# Patient Record
Sex: Male | Born: 1985 | Race: White | Hispanic: No | Marital: Single | Smoking: Former smoker
Health system: Southern US, Community
[De-identification: ages and names within clinical notes are randomized; demographics above are authoritative.]

## PROBLEM LIST (undated history)

## (undated) DIAGNOSIS — F419 Anxiety disorder, unspecified: Secondary | ICD-10-CM

## (undated) DIAGNOSIS — Z87442 Personal history of urinary calculi: Secondary | ICD-10-CM

## (undated) DIAGNOSIS — F329 Major depressive disorder, single episode, unspecified: Secondary | ICD-10-CM

## (undated) DIAGNOSIS — F250 Schizoaffective disorder, bipolar type: Secondary | ICD-10-CM

## (undated) DIAGNOSIS — N200 Calculus of kidney: Secondary | ICD-10-CM

## (undated) HISTORY — PX: NO PAST SURGERIES: SHX2092

---

## 2007-04-16 ENCOUNTER — Inpatient Hospital Stay (HOSPITAL_COMMUNITY): Admission: EM | Admit: 2007-04-16 | Discharge: 2007-04-19 | Payer: Self-pay | Admitting: Emergency Medicine

## 2007-08-28 ENCOUNTER — Encounter: Admission: RE | Admit: 2007-08-28 | Discharge: 2007-08-28 | Payer: Self-pay | Admitting: Family Medicine

## 2007-08-28 ENCOUNTER — Ambulatory Visit: Payer: Self-pay | Admitting: Family Medicine

## 2007-08-28 DIAGNOSIS — IMO0002 Reserved for concepts with insufficient information to code with codable children: Secondary | ICD-10-CM | POA: Insufficient documentation

## 2007-08-28 DIAGNOSIS — M79609 Pain in unspecified limb: Secondary | ICD-10-CM

## 2008-06-30 ENCOUNTER — Inpatient Hospital Stay (HOSPITAL_COMMUNITY): Admission: RE | Admit: 2008-06-30 | Discharge: 2008-07-03 | Payer: Self-pay | Admitting: Psychiatry

## 2008-06-30 ENCOUNTER — Emergency Department (HOSPITAL_COMMUNITY): Admission: EM | Admit: 2008-06-30 | Discharge: 2008-06-30 | Payer: Self-pay | Admitting: Emergency Medicine

## 2008-06-30 ENCOUNTER — Ambulatory Visit: Payer: Self-pay | Admitting: Psychiatry

## 2010-09-07 LAB — ETHANOL: Alcohol, Ethyl (B): 209 mg/dL — ABNORMAL HIGH (ref 0–10)

## 2010-09-07 LAB — POCT I-STAT, CHEM 8
Calcium, Ion: 1.11 mmol/L — ABNORMAL LOW (ref 1.12–1.32)
Chloride: 106 mEq/L (ref 96–112)
Glucose, Bld: 84 mg/dL (ref 70–99)
TCO2: 29 mmol/L (ref 0–100)

## 2010-09-07 LAB — COMPREHENSIVE METABOLIC PANEL
BUN: 18 mg/dL (ref 6–23)
CO2: 29 mEq/L (ref 19–32)
GFR calc Af Amer: >60 mL/min (ref >60)
Glucose, Bld: 97 mg/dL (ref 70–99)
Sodium: 139 mEq/L (ref 135–145)
Total Bilirubin: 1 mg/dL (ref 0.3–1.2)
Total Protein: 8.1 g/dL (ref 6.0–8.3)

## 2010-09-07 LAB — ACETAMINOPHEN LEVEL: Acetaminophen (Tylenol), Serum: <10 ug/mL — ABNORMAL LOW (ref 10–30)

## 2010-09-07 LAB — RAPID URINE DRUG SCREEN, HOSP PERFORMED
Benzodiazepines: POSITIVE — AB
Cocaine: NOT DETECTED
Tetrahydrocannabinol: NOT DETECTED

## 2010-10-05 NOTE — Discharge Summary (Signed)
Walter Howe, Walter Howe            ACCOUNT NO.:  [id number]   MEDICAL RECORD NO.:  [id number]          PATIENT TYPE:  INP   LOCATION:  5735                         FACILITY:  [facility]   PHYSICIAN:  Isidor Holts, M.D.  DATE OF BIRTH:  Jul 15, 1985   DATE OF ADMISSION:  04/16/2007  DATE OF DISCHARGE:  04/19/2007                               DISCHARGE SUMMARY   PMD:  Unassigned.   DISCHARGE DIAGNOSES:  1. Acute Pancreatitis.  2. Right nephrolithiasis.   DISCHARGE MEDICATIONS:  1. Vicodin (5/325) one p.o. p.r.n. q.8 hourly. A total of 15 pills      have been dispensed.  2. Prilosec OTC 20 mg p.o. daily for one week only.   PROCEDURES:  1. Abdominal ultrasound scan dated April 16, 2007.  This was a      negative abdominal ultrasound, no gallstones.  2. Abdominal CT scan dated April 16, 2007.  This showed      pancreatitis and right nephrolithiasis.   CONSULTATIONS:  None.   ADMISSION HISTORY OF PRESENT ILLNESS:  As in H&P notes of April 16, 2007.  However, in brief this is a 25 year old male with no significant  past medical history, who presents with a one day history of upper  abdominal pain and vomiting.  He did have a transient episode of  diarrhea in the three days prior, but this had resolved by April 15, 2007.  On initial evaluation in the emergency department he was found to  have serum lipase 154.  Abdominal ultrasound scan showed no evidence of  gallstones.  He was admitted for further evaluation and investigation  and management.   CLINICAL COURSE:  1. Acute pancreatitis.  On detailed questioning, it appears that      patient had binged on alcohol, a couple of days before onset of      symptoms.  Although he has no history of chronic alcohol abuse,      this was likely the culprit for acute inflammation of the pancreas.      Abdominal CT scan was done on April 16, 2007.  This confirmed      acute pancreatitis, but showed no biliary tract  abnormalities.      There was an incidental finding of right nephrolithiasis.  Lipid      profile showed the following findings:  Total cholesterol 116,      triglycerides 63, HDL 45, LDL 58.  The patient was managed with      bowel rest, intravenous fluid hydration, proton pump inhibitor, and      analgesics.  By April 19, 2007, lipase was entirely normal at      16.  We were able to commence patient on clear fluid orally on      April 17, 2007, and advance to a regular low fat diet on      April 18, 2007 which he tolerated quite well.  The patient was      completely asymptomatic by April 19, 2007.   1. Right nephrolithiasis.  As mentioned in #2 above abdominal CT scan      showed  incidentally a small calculus in the interpolar region of      the right kidney.  This was asymptomatic.  The patient has been      informed accordingly.   DISPOSITION:  The patient was considered sufficiently recovered and  clinically stable to be discharged on April 19, 2007.  He is  recommended to return to regular duties on April 23, 2007.   DIET:  Low fat diet for one week.   ACTIVITY:  No restrictions.   FOLLOWUP:  This is not applicable, although the patient is recommended  to establish a primary MD for routine and preventive purposes.   DISCHARGE INSTRUCTIONS:  The patient has been strongly cautioned to  avoid alcohol.      Isidor Holts, M.D.  Electronically Signed     CO/MEDQ  D:  04/19/2007  T:  04/19/2007  Job:  [id number]

## 2010-10-05 NOTE — H&P (Signed)
NAMEJAVONTE, ELENES            ACCOUNT NO.:  [id number]   MEDICAL RECORD NO.:  [id number]          PATIENT TYPE:  IPS   LOCATION:  0507                          [facility]ACILITY[facility]  [facility]   PHYSICIAN:  Anselm Jungling, MD  DATE OF BIRTH:  [birth year]   DATE OF ADMISSION:  06/30/2008  DATE OF DISCHARGE:                       PSYCHIATRIC ADMISSION ASSESSMENT   IDENTIFYING INFORMATION:  A 25 year old male.  This is a voluntary  admission.   HISTORY OF THE PRESENT ILLNESS:  First [facility] admission for this 22-year-  old who presented in the emergency room after taking approximately 10  Xanax 0.5 mg tablets and subsequently drinking multiple shots of vodka.  He says I was depressed and stressed out.  He says that the Xanax  tablets were some that he had left over and he took all of them at once.  Does not really remember how much vodka he drank, denies having any  arguments or any acute stressors yesterday.  He was at his friend's  house and they found him on the bathroom floor and brought him to the  emergency room.  Today, he is oriented to person and basically the  situation.  He does not know what day it is and has a vague memory of  events occurring in the recent past.  He says that he has been drinking  alcohol heavily about 4 days out of every 7 days for the past 3 months  and most of those times drinking until he completely passes out.  Prior  to the past 3 months, he had several months of abstinence and is unable  to account for why he may have relapsed on the alcohol.  He does say  that he has been stressed by living with his grandmother who has  terminal cancer and recently his uncle and nephew moved into the home.  There has been a lot of arguing, fighting, and uproar in the house.  He  says that that helps to make him feel very overwhelmed and not able to  cope.  He appears to be having some thought blocking today and his  insight is quite impaired.   PAST PSYCHIATRIC HISTORY:  First  [facility] admission.  He has a history of 1  prior medical admission for acute pancreatitis in 2008 to our medical  unit and at that time was advised to stay off alcohol.  He reports that  he has had problems with drinking alcohol heavily for about 2 years but  has no history of alcohol detox or drug rehab.  Before beginning to  drink alcohol heavily, he reports that he was a regular user of cocaine  and marijuana.  He reports a history of mood problems and at times gets  so depressed and sometimes agitated that he has been unable to function  at work.  He was once prescribed an antidepressant as an outpatient  which he took for 3 days.  He felt much better and so stopped it.  He  does not remember the medication.  He is under no current outpatient  treatment.  Denies prior psychiatric hospitalizations.   SOCIAL HISTORY:  Single 25 year old male living in [place] with his  grandmother.  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  Never married.  No children.  He denies any current legal  problems.   FAMILY HISTORY:  Not available.   MEDICAL HISTORY:   PRIMARY CARE Orilla Templeman:  Not known.   MEDICAL PROBLEMS:  None.   PAST MEDICAL HISTORY:  Significant for:  1. Acute pancreatitis in November of 2008.  2. History of kidney stones in November of 2008.   CURRENT MEDICATIONS:  None.   DRUG ALLERGIES:  NONE.   POSITIVE PHYSICAL FINDINGS:  Slight-built Caucasian male in no distress.  Initial alcohol level 209 mg/dL.  Urine drug screen positive for  benzodiazepines.  Sodium very slightly elevated at 146.  Salicylate  level less than 4.  Acetaminophen level less than 10.  Chemistry, sodium  139, potassium 3.7, chloride 101, carbon dioxide 29, BUN 18, creatinine  0.81, random glucose 97, SGOT 39, SGPT 19, alkaline phosphatase 79,  total bilirubin 1.0, albumin 4.6, and calcium 9.6.   MENTAL STATUS  EXAM:  Fully alert male, pleasant, cooperative, but is  somewhat dazed affect, appears somewhat anxious.  No diaphoresis.  Oriented to person and situation.  Confused about days.  Having  difficulty with recall of specific events.  Immediate memory is intact.  Recent memory is poor.  Distant memory is intact.  Speech normal in  pace, tone, and production.  Mood appears anxious.  No tremor.  No  obvious neurologic signs.  Gait is stable.  No tremor.  No ataxia.  Thought process is logical, goal directed.  Responses are relevant and  appropriate.  Insight impaired.  Judgment impaired.  Impulse control  guarded.  Denying active suicidal or homicidal thoughts.   AXIS I:  1. Alcohol abuse, rule out dependence.  2. Rule out mood disorder, NOS.  AXIS II:  Deferred.  AXIS III:  No diagnosis.  AXIS IV:  Deferred.  AXIS V:  Current 38, past year not known.   PLAN:  Voluntarily admit him with a goal of stabilizing him.  He  recognizes that he has a problem with alcohol and is able to voice that  clearly.  We are starting him on a Librium protocol.  He is enrolled in  our dual diagnosis program.  Meanwhile, we are going to check a CBC, an  RPR, and a TSH and he has given [place] permission to speak with his parents  and would like to have them involved in treatment.      Margaret A. Lorin Picket, N.P.      Anselm Jungling, MD  Electronically Signed    MAS/MEDQ  D:  07/01/2008  T:  07/01/2008  Job:  [id number]

## 2010-10-05 NOTE — H&P (Signed)
Walter Howe            ACCOUNT NO.:  [id number]   MEDICAL RECORD NO.:  [id number]          PATIENT TYPE:  INP   LOCATION:  5735                         FACILITY:  [facility]   PHYSICIAN:  Isidor Holts, M.D.  DATE OF BIRTH:  1985/08/02   DATE OF ADMISSION:  04/16/2007  DATE OF DISCHARGE:                              HISTORY & PHYSICAL   PRIMARY MEDICAL DOCTOR:  Unassigned.   CHIEF COMPLAINT:  Abdominal pain and vomiting for 1 day.  Also,  transient diarrhea for the past 3 days, stopped on April 15, 2007.   HISTORY OF PRESENT ILLNESS:  This is a 25 year old male with no  significant past medical history.  According to patient, he drank quite  a lot of vodka, about 4 shots, on April 13, 2007, and again on  April 14, 2007 while at a friend's house.  Denies drug use.  About  3:30 a.m. on April 16, 2007, he developed upper abdominal pain  without any radiation, constant but fluctuating in intensity, associated  with vomiting about 3-4 times.  Vomited food.  Denies coffee ground  emesis.  In addition, the patient states that he had diarrhea for about  3 days prior, but this stopped on April 15, 2007.  He has had no  recurrences since.  Denies fever.  Denies shortness of breath.   PAST MEDICAL HISTORY:  Nothing of significance.   MEDICATION HISTORY:  The patient is not on any regular medication.   ALLERGIES:  No known drug allergies.   REVIEW OF SYSTEMS:  As per HPI and chief complaint otherwise negative.   SOCIAL HISTORY:  The patient is single.  He is a nonsmoker/nondrinker.  Works at [employer], and also at [employer].  Denies  alcohol abuse, although he occasionally binges.   FAMILY HISTORY:  Noncontributory.   PHYSICAL EXAMINATION:  VITAL SIGNS:  Temperature 98.3, pulse 73 per  minute and regular, respiratory rate 18, BP 116/71 mmHg, pulse oximeter  98% on room air.  The patient does not appear to be in obvious acute distress at time of  this evaluation, after administration of analgesics in the emergency  department.  He is alert, communicative, not short of breath at rest.  HEENT: No clinical pallor or jaundice.  No conjunctival injection.  THROAT:  Visible mucous membranes appear dry.  NECK:  Supple.  JVP not seen.  No palpable lymphadenopathy.  No palpable  goiter.  CHEST:  Clinically clear to auscultation.  No wheezes or crackles.  HEART SOUNDS:  1 and 2 heard.  Normal, regular; no murmurs.  ABDOMEN:  Flat, soft; tender in the epigastrium.  No guarding, no  rebound.  Bowel sounds are heard.  LOWER EXTREMITY EXAMINATION:  No pitting edema.  Palpable peripheral  pulses.  MUSCULOSKELETAL SYSTEM:  Unremarkable.  CENTRAL NERVOUS SYSTEM:  No focal neurologic deficits on gross  examination.   INVESTIGATIONS:  CBC:  WBC 13.4, neutrophils 63%, hemoglobin 17,  hematocrit 50, platelets 233.  Electrolytes:  sodium 138, potassium 3.7,  chloride 104, CO2 28.7, BUN 14, creatinine 1.1, glucose 83.  AST 22, ALT  11, alkaline phosphatase  53, lipase 154.  Abdominal ultrasound scan  dated April 16, 2007 is negative.  No evidence of gallstones.   ASSESSMENT AND PLAN:  Acute pancreatitis.  Etiology is likely acute  alcoholic binge, however, we shall do lipid profile to rule out  hypertriglyceridemia as a possible culprit.  The patient's abdominal  ultrasound scan shows no evidence of gallstones.  We shall admit the  patient, institute bowel rest, administer intravenous fluid hydration,  analgesics, proton pump inhibitor and antiemetics.  For completeness, we  will do abdominal CT scan to elucidate anatomy.  We shall also do urine  drug screen.   Further management will depend on clinical course.      Isidor Holts, M.D.  Electronically Signed     CO/MEDQ  D:  04/16/2007  T:  04/17/2007  Job:  [id number]

## 2010-10-08 NOTE — Discharge Summary (Signed)
NAMEOSAZE, [name]            ACCOUNT NO.:  [id number]   MEDICAL RECORD NO.:  [id number]          PATIENT TYPE:  IPS   LOCATION:  0507                          [facility]   PHYSICIAN:  Anselm Jungling, MD  DATE OF BIRTH:  04-Jun-1985   DATE OF ADMISSION:  06/30/2008  DATE OF DISCHARGE:  07/03/2008                               DISCHARGE SUMMARY   IDENTIFYING DATA AND REASON FOR ADMISSION:  This was an inpatient  psychiatric admission for Walter Howe, a 25 year old single white male  admitted after ingestion of Xanax and vodka.  He had been cutting on  himself, and in a state of delirium had apparently expressed some  suicidal ideation.  He had no known prior psychiatric history.  Please  refer to the admission note for further details pertaining to the  symptoms, circumstances and history that led to his hospitalization.  He  was given an initial Axis I diagnosis of polysubstance abuse, and rule  out psychosis NOS.   MEDICAL AND LABORATORY:  The patient was medically and physically  assessed by the psychiatric nurse practitioner.  He was in good health  without any active or chronic medical problems.  There were no  significant medical issues.   HOSPITAL COURSE:  The patient was admitted to the adult inpatient  psychiatric service.  He presented as a well-nourished, normally-  developed adult male who was slender, appearing younger than the age of  52.  He was alert and fully oriented.  He was pleasant, polite, but  unable to give much of a rationale for what happened to him that led to  his hospitalization.  However, he appeared to accept being on an  inpatient mental health unit and getting help there.  There were no  signs or symptoms of psychosis or thought disorder evident.   The patient was not treated with any psychotropic medications, pending  acquisition of further collateral information.   The patient had been living with his grandparents and planned to return  there.  He indicated that he was not depressed, that he was feeling  better after getting over his hangover from his alcohol ingestion.   On the fourth hospital day, he appeared appropriate for discharge.  He  agreed to the following aftercare plan.   AFTERCARE:  The patient was to follow up at [facility] with  an appointment on July 08, 2008 at 8 a.m.   DISCHARGE MEDICATIONS:  None.   DISCHARGE DIAGNOSIS:  AXIS I:  Polysubstance abuse not otherwise  specified.  AXIS II:  Deferred.  AXIS III:  No acute or chronic illnesses.  AXIS IV:  Stressors severe.  AXIS V:  GAF on discharge 65.      Anselm Jungling, MD  Electronically Signed     SPB/MEDQ  D:  07/07/2008  T:  07/07/2008  Job:  [id number]

## 2011-03-01 LAB — I-STAT 8, (EC8 V) (CONVERTED LAB)
Acid-Base Excess: 3 — ABNORMAL HIGH
BUN: 14
HCT: 50
Operator id: 198171
Sodium: 138
TCO2: 30
pCO2, Ven: 45.5
pH, Ven: 7.408 — ABNORMAL HIGH

## 2011-03-01 LAB — HEPATIC FUNCTION PANEL
ALT: 11
AST: 22
Albumin: 4.7
Alkaline Phosphatase: 53
Total Bilirubin: 1.1
Total Protein: 7.4

## 2011-03-01 LAB — DIFFERENTIAL
Eosinophils Absolute: 0 — ABNORMAL LOW
Lymphocytes Relative: 30
Monocytes Relative: 6
Neutro Abs: 8.5 — ABNORMAL HIGH
Neutrophils Relative %: 63

## 2011-03-01 LAB — LIPID PANEL
Cholesterol: 116
LDL Cholesterol: 58
Total CHOL/HDL Ratio: 2.6
Triglycerides: 63
VLDL: 13

## 2011-03-01 LAB — BASIC METABOLIC PANEL
BUN: 10
BUN: 6
CO2: 25
CO2: 29
Calcium: 8.4
Calcium: 8.5
Chloride: 102
Creatinine, Ser: 0.81
GFR calc Af Amer: >60
Glucose, Bld: 83
Potassium: 3.4 — ABNORMAL LOW
Sodium: 138

## 2011-03-01 LAB — URINALYSIS, ROUTINE W REFLEX MICROSCOPIC
Bilirubin Urine: NEGATIVE
Glucose, UA: NEGATIVE
Specific Gravity, Urine: 1.01
pH: 7.5

## 2011-03-01 LAB — LIPASE, BLOOD: Lipase: 154 — ABNORMAL HIGH

## 2011-03-01 LAB — POCT I-STAT CREATININE: Operator id: 198171

## 2011-03-01 LAB — BASIC METABOLIC PANEL WITH GFR
BUN: 4 — ABNORMAL LOW
CO2: 25
Calcium: 8.4
Chloride: 98
Creatinine, Ser: 0.99
GFR calc non Af Amer: >60
Glucose, Bld: 70
Potassium: 4.1
Sodium: 131 — ABNORMAL LOW

## 2011-03-01 LAB — CBC
HCT: 41.7
MCHC: 34.3
RBC: 5.36
RDW: 12.3
WBC: 12.4 — ABNORMAL HIGH

## 2011-03-01 LAB — URINE DRUGS OF ABUSE SCREEN W ALC, ROUTINE (REF LAB)
Amphetamine Screen, Ur: NEGATIVE
Cocaine Metabolites: NEGATIVE
Creatinine,U: 63.7
Phencyclidine (PCP): NEGATIVE

## 2011-03-01 LAB — B-NATRIURETIC PEPTIDE (CONVERTED LAB): Pro B Natriuretic peptide (BNP): 89

## 2011-03-01 LAB — THC (MARIJUANA), URINE, CONFIRMATION: Marijuana, Ur-Confirmation: 30 ng/mL

## 2011-03-01 LAB — MAGNESIUM: Magnesium: 1.8

## 2012-06-01 ENCOUNTER — Emergency Department (HOSPITAL_COMMUNITY): Payer: Self-pay

## 2012-06-01 ENCOUNTER — Emergency Department (HOSPITAL_COMMUNITY)
Admission: EM | Admit: 2012-06-01 | Discharge: 2012-06-01 | Disposition: A | Discharge status: Home / Self Care | Payer: Self-pay | Attending: Emergency Medicine | Admitting: Emergency Medicine

## 2012-06-01 ENCOUNTER — Encounter (HOSPITAL_COMMUNITY): Payer: Self-pay | Admitting: Emergency Medicine

## 2012-06-01 DIAGNOSIS — IMO0001 Reserved for inherently not codable concepts without codable children: Secondary | ICD-10-CM | POA: Insufficient documentation

## 2012-06-01 DIAGNOSIS — F17200 Nicotine dependence, unspecified, uncomplicated: Secondary | ICD-10-CM | POA: Insufficient documentation

## 2012-06-01 DIAGNOSIS — R059 Cough, unspecified: Secondary | ICD-10-CM | POA: Insufficient documentation

## 2012-06-01 DIAGNOSIS — R6889 Other general symptoms and signs: Secondary | ICD-10-CM

## 2012-06-01 DIAGNOSIS — J3489 Other specified disorders of nose and nasal sinuses: Secondary | ICD-10-CM | POA: Insufficient documentation

## 2012-06-01 DIAGNOSIS — R05 Cough: Secondary | ICD-10-CM

## 2012-06-01 LAB — CBC WITH DIFFERENTIAL/PLATELET
Basophils Absolute: 0 K/uL (ref 0.0–0.1)
Basophils Relative: 0 % (ref 0–1)
Eosinophils Absolute: 0 10*3/uL (ref 0.0–0.7)
Eosinophils Relative: 0 % (ref 0–5)
HCT: 47.6 % (ref 39.0–52.0)
Hemoglobin: 17.8 g/dL — ABNORMAL HIGH (ref 13.0–17.0)
Lymphocytes Relative: 37 % (ref 12–46)
Lymphs Abs: 2.5 K/uL (ref 0.7–4.0)
MCH: 31.4 pg (ref 26.0–34.0)
MCHC: 37.4 g/dL — ABNORMAL HIGH (ref 30.0–36.0)
MCV: 84 fL (ref 78.0–100.0)
Monocytes Absolute: 0.8 10*3/uL (ref 0.1–1.0)
Monocytes Relative: 11 % (ref 3–12)
Neutro Abs: 3.6 10*3/uL (ref 1.7–7.7)
Neutrophils Relative %: 52 % (ref 43–77)
Platelets: 166 K/uL (ref 150–400)
RBC: 5.67 MIL/uL (ref 4.22–5.81)
RDW: 12 % (ref 11.5–15.5)
WBC: 6.9 K/uL (ref 4.0–10.5)

## 2012-06-01 LAB — COMPREHENSIVE METABOLIC PANEL WITH GFR
ALT: 13 U/L (ref 0–53)
AST: 30 U/L (ref 0–37)
Alkaline Phosphatase: 75 U/L (ref 39–117)
CO2: 29 meq/L (ref 19–32)
Calcium: 9.9 mg/dL (ref 8.4–10.5)
GFR calc Af Amer: >90 mL/min (ref >90)
GFR calc non Af Amer: >90 mL/min (ref >90)
Glucose, Bld: 87 mg/dL (ref 70–99)
Potassium: 3.9 meq/L (ref 3.5–5.1)
Sodium: 138 meq/L (ref 135–145)
Total Protein: 8.6 g/dL — ABNORMAL HIGH (ref 6.0–8.3)

## 2012-06-01 LAB — COMPREHENSIVE METABOLIC PANEL
Albumin: 4.6 g/dL (ref 3.5–5.2)
BUN: 17 mg/dL (ref 6–23)
Chloride: 94 mEq/L — ABNORMAL LOW (ref 96–112)
Creatinine, Ser: 0.75 mg/dL (ref 0.50–1.35)
Total Bilirubin: 0.6 mg/dL (ref 0.3–1.2)

## 2012-06-01 MED ORDER — BENZONATATE 100 MG PO CAPS
200.0000 mg | ORAL_CAPSULE | Freq: Once | ORAL | Status: AC
  Administered 2012-06-01: 200 mg via ORAL
  Filled 2012-06-01: qty 2

## 2012-06-01 MED ORDER — BENZONATATE 100 MG PO CAPS: 100.0000 mg | ORAL_CAPSULE | Freq: Three times a day (TID) | ORAL | Status: DC | PRN

## 2012-06-01 NOTE — ED Notes (Signed)
Pt c/o non-productive cough, and body aches since Mon.  No appetite or energy.  Also elevated temp off and on.  Mask placed on pt in triage.

## 2012-06-01 NOTE — ED Provider Notes (Signed)
I saw and evaluated the patient, reviewed the resident's note and I agree with the findings and plan.  Patient with no fever, has symptoms of myalgias and flulike symptoms. He is a positive sick contact. Patient's symptoms seem to be resolving at this time, as noted no respiratory distress and is nontoxic in appearance. The patient is reassured and is stable for discharge to home. He can take antipyretics and push by mouth fluids at home.  Gavin Pound. Tali Cleaves, MD 06/01/12 2320

## 2012-06-01 NOTE — ED Provider Notes (Signed)
History     CSN: [id number]  Arrival date & time 06/01/12  1842   First MD Initiated Contact with Patient 06/01/12 2202      Chief Complaint  Patient presents with  . Generalized Body Aches    (Consider location/radiation/quality/duration/timing/severity/associated sxs/prior treatment) HPI chief complaint: Flulike symptoms. Onset: 5 days ago. Improving spontaneously. Severity: Mild. Timing: Constant. For signs and symptoms to review of systems. Regarding patient's social history he does smoke. I have reviewed patient's past medical, past surgical, social history as well as medications and allergies. Context: Patient's roommate was regionally diagnosed with the flu.  History reviewed. No pertinent past medical history.  History reviewed. No pertinent past surgical history.  No family history on file.  History  Substance Use Topics  . Smoking status: Current Every Day Smoker  . Smokeless tobacco: Not on file  . Alcohol Use: Yes     Comment: ocassional      Review of Systems  Constitutional: Negative for fever and chills.  HENT: Positive for rhinorrhea. Negative for hearing loss, ear pain, congestion, sore throat, facial swelling, trouble swallowing, neck pain, neck stiffness, voice change, sinus pressure and ear discharge.   Eyes: Negative for visual disturbance.  Respiratory: Positive for choking. Negative for cough, chest tightness and shortness of breath.   Cardiovascular: Negative for chest pain, palpitations and leg swelling.  Gastrointestinal: Negative for nausea, vomiting, abdominal pain, diarrhea, constipation, blood in stool and abdominal distention.  Genitourinary: Negative for dysuria, urgency, hematuria and difficulty urinating.  Musculoskeletal: Positive for myalgias. Negative for back pain and gait problem.  Skin: Negative for rash.  Neurological: Negative for dizziness, tremors, seizures, syncope, facial asymmetry, speech difficulty, weakness, light-headedness,  numbness and headaches.  Hematological: Negative for adenopathy. Does not bruise/bleed easily.  Psychiatric/Behavioral: Negative for confusion.    Allergies  Review of patient's allergies indicates no known allergies.  Home Medications  No current outpatient prescriptions on file.  BP 116/80  Pulse 129  Temp 99.7 F (37.6 C) (Oral)  Resp 16  SpO2 100%  Physical Exam  Constitutional: He is oriented to person, place, and time. He appears well-developed and well-nourished. No distress.  HENT:  Head: Normocephalic.  Eyes: Conjunctivae normal are normal.  Neck: Normal range of motion. Neck supple.  Cardiovascular: Normal rate, regular rhythm, normal heart sounds and intact distal pulses.   No murmur heard. Pulmonary/Chest: Effort normal and breath sounds normal. No respiratory distress. He has no wheezes. He has no rales.  Abdominal: Soft. Bowel sounds are normal. He exhibits no distension. There is no tenderness.  Musculoskeletal: Normal range of motion. He exhibits no edema and no tenderness.  Neurological: He is alert and oriented to person, place, and time.  Skin: Skin is warm and dry. He is not diaphoretic.  Psychiatric: He has a normal mood and affect.    ED Course  Procedures (including critical care time)  Labs Reviewed  CBC WITH DIFFERENTIAL - Abnormal; Notable for the following:    Hemoglobin 17.8 (*)     MCHC 37.4 (*)  RULED OUT INTERFERING SUBSTANCES   All other components within normal limits  COMPREHENSIVE METABOLIC PANEL - Abnormal; Notable for the following:    Chloride 94 (*)     Total Protein 8.6 (*)     All other components within normal limits   Dg Chest 2 View  06/01/2012  *RADIOLOGY REPORT*  Clinical Data: Cough and congestion  CHEST - 2 VIEW  Comparison: None  Findings: Lungs are hyperinflated but  clear.  No pleural effusion or edema identified.  No airspace consolidation noted.  Visualized osseous structures appear unremarkable.  IMPRESSION:  1.  No  active cardiopulmonary abnormalities.   Original Report Authenticated By: Signa Kell, M.D.      1. Flu-like symptoms   2. Cough       MDM  Patient is a very well-appearing 27 year old male presenting with 5 days of flulike symptoms that are resolving. Patient states he came because he has a mild nonproductive cough which is also improving. Vital stable. Afebrile. Recent influenza contact but given patient has had symptoms for 5 days which are improving I feel no need to test and patient outside the window for Tamiflu. Labs unremarkable. Chest x-ray negative. Discharge with Jerilynn Som and return precautions.        Consuello Masse, MD 06/01/12 [phone]

## 2012-06-18 ENCOUNTER — Emergency Department (HOSPITAL_COMMUNITY)
Admission: EM | Admit: 2012-06-18 | Discharge: 2012-06-18 | Disposition: A | Discharge status: Home / Self Care | Payer: Self-pay | Attending: Emergency Medicine | Admitting: Emergency Medicine

## 2012-06-18 ENCOUNTER — Encounter (HOSPITAL_COMMUNITY): Payer: Self-pay | Admitting: *Deleted

## 2012-06-18 ENCOUNTER — Emergency Department (HOSPITAL_COMMUNITY): Payer: Self-pay

## 2012-06-18 DIAGNOSIS — N200 Calculus of kidney: Secondary | ICD-10-CM | POA: Insufficient documentation

## 2012-06-18 DIAGNOSIS — R112 Nausea with vomiting, unspecified: Secondary | ICD-10-CM | POA: Insufficient documentation

## 2012-06-18 DIAGNOSIS — F17200 Nicotine dependence, unspecified, uncomplicated: Secondary | ICD-10-CM | POA: Insufficient documentation

## 2012-06-18 DIAGNOSIS — N390 Urinary tract infection, site not specified: Secondary | ICD-10-CM | POA: Insufficient documentation

## 2012-06-18 HISTORY — DX: Calculus of kidney: N20.0

## 2012-06-18 LAB — URINALYSIS, ROUTINE W REFLEX MICROSCOPIC
Ketones, ur: 15 mg/dL — AB
Nitrite: NEGATIVE
Urobilinogen, UA: 1 mg/dL (ref 0.0–1.0)

## 2012-06-18 LAB — CBC WITH DIFFERENTIAL/PLATELET
Lymphocytes Relative: 23 % (ref 12–46)
Lymphs Abs: 3.1 10*3/uL (ref 0.7–4.0)
MCV: 86.6 fL (ref 78.0–100.0)
Neutro Abs: 9.1 10*3/uL — ABNORMAL HIGH (ref 1.7–7.7)
Neutrophils Relative %: 67 % (ref 43–77)
Platelets: 161 10*3/uL (ref 150–400)
RBC: 5.09 MIL/uL (ref 4.22–5.81)
WBC: 13.5 10*3/uL — ABNORMAL HIGH (ref 4.0–10.5)

## 2012-06-18 LAB — COMPREHENSIVE METABOLIC PANEL
ALT: 9 U/L (ref 0–53)
Alkaline Phosphatase: 80 U/L (ref 39–117)
CO2: 27 mEq/L (ref 19–32)
Chloride: 97 mEq/L (ref 96–112)
GFR calc Af Amer: >90 mL/min (ref >90)
GFR calc non Af Amer: 81 mL/min — ABNORMAL LOW (ref >90)
Glucose, Bld: 100 mg/dL — ABNORMAL HIGH (ref 70–99)
Potassium: 3.7 mEq/L (ref 3.5–5.1)
Sodium: 135 mEq/L (ref 135–145)

## 2012-06-18 LAB — URINE MICROSCOPIC-ADD ON

## 2012-06-18 MED ORDER — OXYCODONE-ACETAMINOPHEN 5-325 MG PO TABS: 1.0000 | ORAL_TABLET | Freq: Four times a day (QID) | ORAL | Status: DC | PRN

## 2012-06-18 MED ORDER — TAMSULOSIN HCL 0.4 MG PO CAPS: 0.4000 mg | ORAL_CAPSULE | Freq: Every day | ORAL | Status: DC

## 2012-06-18 MED ORDER — ONDANSETRON HCL 4 MG/2ML IJ SOLN
4.0000 mg | Freq: Once | INTRAMUSCULAR | Status: AC
  Administered 2012-06-18: 4 mg via INTRAVENOUS

## 2012-06-18 MED ORDER — SULFAMETHOXAZOLE-TRIMETHOPRIM 800-160 MG PO TABS: 1.0000 | ORAL_TABLET | Freq: Two times a day (BID) | ORAL | Status: DC

## 2012-06-18 MED ORDER — HYDROMORPHONE HCL PF 1 MG/ML IJ SOLN
INTRAMUSCULAR | Status: AC
  Administered 2012-06-18: 0.5 mg via INTRAVENOUS
  Filled 2012-06-18: qty 1

## 2012-06-18 MED ORDER — DEXTROSE 5 % IV SOLN
1.0000 g | Freq: Once | INTRAVENOUS | Status: AC
  Administered 2012-06-18: 1 g via INTRAVENOUS
  Filled 2012-06-18: qty 10

## 2012-06-18 MED ORDER — HYDROMORPHONE HCL PF 1 MG/ML IJ SOLN
0.5000 mg | Freq: Once | INTRAMUSCULAR | Status: AC
  Administered 2012-06-18: 0.5 mg via INTRAVENOUS

## 2012-06-18 MED ORDER — ONDANSETRON HCL 4 MG PO TABS
4.0000 mg | ORAL_TABLET | Freq: Four times a day (QID) | ORAL | Status: DC
Start: 2012-06-18 — End: 2015-07-08

## 2012-06-18 MED ORDER — SODIUM CHLORIDE 0.9 % IV SOLN
Freq: Once | INTRAVENOUS | Status: AC
  Administered 2012-06-18: 11:00:00 via INTRAVENOUS

## 2012-06-18 MED ORDER — HYDROMORPHONE HCL PF 1 MG/ML IJ SOLN
0.5000 mg | Freq: Once | INTRAMUSCULAR | Status: AC
  Administered 2012-06-18: 0.5 mg via INTRAVENOUS
  Filled 2012-06-18: qty 1

## 2012-06-18 MED ORDER — ONDANSETRON HCL 4 MG/2ML IJ SOLN
INTRAMUSCULAR | Status: AC
  Administered 2012-06-18: 4 mg via INTRAVENOUS
  Filled 2012-06-18: qty 2

## 2012-06-18 NOTE — ED Provider Notes (Signed)
Medical screening examination/treatment/procedure(s) were performed by non-physician practitioner and as supervising physician I was immediately available for consultation/collaboration.   Carleene Cooper III, MD 06/18/12 2030

## 2012-06-18 NOTE — ED Notes (Signed)
C/o left flank pain, n/v onset yesterday worsening this morning. Reports feels like same pain when he had previous kidney stone. Denies hematuria, dysuria, frequency.

## 2012-06-18 NOTE — ED Provider Notes (Signed)
History     CSN: [id number]  Arrival date & time 06/18/12  5621   First MD Initiated Contact with Patient 06/18/12 1035      Chief Complaint  Patient presents with  . Flank Pain    (Consider location/radiation/quality/duration/timing/severity/associated sxs/prior treatment) HPI  27 year old male with history of kidney stones presents complaining of flank pain. Patient reports experiencing sharp pain to his left flank yesterday while sitting at the computer. Pain is intermittent, moderate in severity, usually lasting from seconds to minutes. Pain is getting progressively worse throughout the day, moderate to severe, nonradiating, with associate nausea and occasional nonbloody nonbilious vomit. Nothing seems to make the pain better or worse. Denies fever, chills, chest pain, shortness of breath, abdominal pain, hematuria, penile pain or scrotal pain. Pain feels similar to prior history of kidney stone, last episode was 4 years ago and affect and the right kidney. No prior history of stenting or lithotripsy. Patient does not have a urologist. Denies any recent trauma.  Past Medical History  Diagnosis Date  . Kidney stone     History reviewed. No pertinent past surgical history.  No family history on file.  History  Substance Use Topics  . Smoking status: Current Every Day Smoker  . Smokeless tobacco: Not on file  . Alcohol Use: Yes     Comment: ocassional      Review of Systems  Constitutional:       A complete 10 system review of systems was obtained and all systems are negative except as noted in the HPI and PMH.    Allergies  Review of patient's allergies indicates no known allergies.  Home Medications   Current Outpatient Rx  Name  Route  Sig  Dispense  Refill  . BENZONATATE 100 MG PO CAPS   Oral   Take 1 capsule (100 mg total) by mouth 3 (three) times daily as needed for cough.   21 capsule   0     BP 125/87  Pulse 103  Temp 98 F (36.7 C) (Oral)  Resp  18  SpO2 99%  Physical Exam  Nursing note and vitals reviewed. Constitutional: He is oriented to person, place, and time. He appears well-developed and well-nourished. He appears distressed (appears uncomfortable laying in bed).  HENT:  Head: Atraumatic.  Eyes: Conjunctivae normal are normal.  Neck: Neck supple.  Cardiovascular: Normal rate and regular rhythm.   Pulmonary/Chest: Effort normal and breath sounds normal.  Abdominal: Soft. There is no tenderness. No hernia.       Significant left CVA tenderness on percussion  Musculoskeletal: He exhibits no edema.  Neurological: He is alert and oriented to person, place, and time.  Skin: Skin is warm. No rash noted.  Psychiatric: He has a normal mood and affect.    ED Course  Procedures (including critical care time)   Labs Reviewed  CBC WITH DIFFERENTIAL  COMPREHENSIVE METABOLIC PANEL  URINALYSIS, ROUTINE W REFLEX MICROSCOPIC   Results for orders placed during the hospital encounter of 06/18/12  CBC WITH DIFFERENTIAL      Component Value Range   WBC 13.5 (*) 4.0 - 10.5 K/uL   RBC 5.09  4.22 - 5.81 MIL/uL   Hemoglobin 15.9  13.0 - 17.0 g/dL   HCT 30.8  65.7 - 84.6 %   MCV 86.6  78.0 - 100.0 fL   MCH 31.2  26.0 - 34.0 pg   MCHC 36.1 (*) 30.0 - 36.0 g/dL   RDW 96.2  95.2 - 84.1 %  Platelets 161  150 - 400 K/uL   Neutrophils Relative 67  43 - 77 %   Neutro Abs 9.1 (*) 1.7 - 7.7 K/uL   Lymphocytes Relative 23  12 - 46 %   Lymphs Abs 3.1  0.7 - 4.0 K/uL   Monocytes Relative 9  3 - 12 %   Monocytes Absolute 1.2 (*) 0.1 - 1.0 K/uL   Eosinophils Relative 1  0 - 5 %   Eosinophils Absolute 0.1  0.0 - 0.7 K/uL   Basophils Relative 0  0 - 1 %   Basophils Absolute 0.0  0.0 - 0.1 K/uL  COMPREHENSIVE METABOLIC PANEL      Component Value Range   Sodium 135  135 - 145 mEq/L   Potassium 3.7  3.5 - 5.1 mEq/L   Chloride 97  96 - 112 mEq/L   CO2 27  19 - 32 mEq/L   Glucose, Bld 100 (*) 70 - 99 mg/dL   BUN 19  6 - 23 mg/dL    Creatinine, Ser 6.96  0.50 - 1.35 mg/dL   Calcium 9.6  8.4 - 29.5 mg/dL   Total Protein 8.2  6.0 - 8.3 g/dL   Albumin 4.5  3.5 - 5.2 g/dL   AST 26  0 - 37 U/L   ALT 9  0 - 53 U/L   Alkaline Phosphatase 80  39 - 117 U/L   Total Bilirubin 0.9  0.3 - 1.2 mg/dL   GFR calc non Af Amer 81 (*) >90 mL/min   GFR calc Af Amer >90  >90 mL/min  URINALYSIS, ROUTINE W REFLEX MICROSCOPIC      Component Value Range   Color, Urine AMBER (*) YELLOW   APPearance CLOUDY (*) CLEAR   Specific Gravity, Urine 1.025  1.005 - 1.030   pH 6.0  5.0 - 8.0   Glucose, UA NEGATIVE  NEGATIVE mg/dL   Hgb urine dipstick LARGE (*) NEGATIVE   Bilirubin Urine SMALL (*) NEGATIVE   Ketones, ur 15 (*) NEGATIVE mg/dL   Protein, ur 30 (*) NEGATIVE mg/dL   Urobilinogen, UA 1.0  0.0 - 1.0 mg/dL   Nitrite NEGATIVE  NEGATIVE   Leukocytes, UA SMALL (*) NEGATIVE  URINE MICROSCOPIC-ADD ON      Component Value Range   WBC, UA 7-10  <3 WBC/hpf   RBC / HPF TOO NUMEROUS TO COUNT  <3 RBC/hpf   Bacteria, UA FEW (*) RARE   Casts HYALINE CASTS (*) NEGATIVE   Ct Abdomen Pelvis Wo Contrast  06/18/2012  *RADIOLOGY REPORT*  Clinical Data: Left flank pain with nausea and vomiting for 2 days. History of renal calculi.  CT ABDOMEN AND PELVIS WITHOUT CONTRAST  Technique:  Multidetector CT imaging of the abdomen and pelvis was performed following the standard protocol without intravenous contrast.  Comparison: CT 04/16/2007.  Findings: There are several small renal calyceal calculi bilaterally.  There is mild left-sided hydronephrosis and hydroureter.  There are two small calculi in the distal left ureter which are best visualized on coronal image number 59.  There is a possible tiny bladder calculus on image number 60.  There is no right-sided hydronephrosis.  The visualized lung bases are clear.  As evaluated in the noncontrast state, the liver, gallbladder, pancreas, spleen and adrenal glands appear normal.  The stomach is poorly distended and  suboptimally evaluated.  Gastric wall thickening cannot be excluded.  The appendix appears normal.  There is trace free pelvic fluid.  IMPRESSION:  1.  Bilateral  nephrolithiasis. 2.  Small distal left ureteral calculi with associated hydronephrosis.  Probable tiny bladder calculus. 3.  Possible gastric wall thickening versus incomplete distension.   Original Report Authenticated By: Carey Bullocks, M.D.    Dg Chest 2 View  06/01/2012  *RADIOLOGY REPORT*  Clinical Data: Cough and congestion  CHEST - 2 VIEW  Comparison: None  Findings: Lungs are hyperinflated but clear.  No pleural effusion or edema identified.  No airspace consolidation noted.  Visualized osseous structures appear unremarkable.  IMPRESSION:  1.  No active cardiopulmonary abnormalities.   Original Report Authenticated By: Signa Kell, M.D.     12:40 PM Patient with history of kidney stones presents with left flank pain suggestive of kidney stone. Pain will control with 0.5 of Dilaudid. His urine shows large amount of hemoglobin. Also evidence of urinary tract infections with 7-10 white blood cells.  2:11 PM  CT of abdomen and pelvis shows bilateral nephrolithiasis and a small left distal ureteral calculi with associated hydronephrosis. Patient was given Rocephin to treat for UTI. Pain is managed with pain medication. Once patient is able to tolerates by mouth he'll be discharged with antibiotic, pain medication, antinausea medication, care instructions, and follow up with urologist. Patient was understanding and agrees with plan.  3:05 PM Patient felt better. Able to tolerates by mouth. Care instructions including using a urine strainer, followup with urologist as needed. We'll discharge with antinausea medication, pain medication, and Flomax. MDM  Pt presenting with L flank pain.  Presentation consistent with acute renal stone. Urinalysis with hematuria and evidence of UTI. Other laboratory evaluation with WBC 13.5. Elected to obtain  a CT scan to rule out significant hydronephrosis and to evaluate for size of the stone. CT showed bilateral nephrolithiasis and small distal L ureteral calculi with associated hydronephrosis.  Provided the patient with pain medication and antinausea medication.   History, physical exam, and radiographic findings were discussed with the patient.  At this time, it is felt that the most likely explanation for the patient's symptoms is acute nephrothiasis.  I also considered Cholecystitis, Appendicitis, Diverticulitis, Abdominal Aortic Aneurysm, Pancreatitis, Urinary Tract Infection, but this appears less likely considering the data gathered thus far.  I have instructed the patient to return to the ER at any time if there are any new or worsening symptoms.  Provided prescription for Percocet and Flomax. Instructed patient to followup with primary physician or urology.  The patient expressed understanding of and agreement with this plan.  Advised to return to ER if any concern for infection, uncontrolled pain, inability to urinate or other concerns.   Impression:   Acute renal stone Flank Pain UTI   Plan:   Discharge from ED F/U with PCP or Urology in 3 days Percocet 5-325 PO q4h PRN PAIN Flomax 0.4 mg Daily Bactrim 800-160mg  PO BID x 3 days Nephrolithiasis handout provided Informed to return to ER if has new or worsening symptoms. Expressed understanding of and agreement with plan and all questions answered.         Fayrene Helper, PA-C 06/18/12 1517

## 2012-06-18 NOTE — ED Notes (Signed)
Pt has 2 friends coming to pick him up and will be waiting in the waiting room for them.

## 2012-06-18 NOTE — ED Notes (Signed)
Pt is here with left lower flank area pain that started last nite and states pains are sharp and intermittent.  Pt has history of kidney stones.  Pt reports nausea and has vomited

## 2012-06-19 LAB — URINE CULTURE: Culture: NO GROWTH

## 2015-07-08 ENCOUNTER — Encounter (HOSPITAL_COMMUNITY): Payer: Self-pay | Admitting: Emergency Medicine

## 2015-07-08 ENCOUNTER — Emergency Department (HOSPITAL_COMMUNITY)
Admission: EM | Admit: 2015-07-08 | Discharge: 2015-07-08 | Disposition: A | Discharge status: Home / Self Care | Payer: Self-pay | Attending: Emergency Medicine | Admitting: Emergency Medicine

## 2015-07-08 DIAGNOSIS — Z789 Other specified health status: Secondary | ICD-10-CM

## 2015-07-08 DIAGNOSIS — F17200 Nicotine dependence, unspecified, uncomplicated: Secondary | ICD-10-CM | POA: Insufficient documentation

## 2015-07-08 DIAGNOSIS — Z79899 Other long term (current) drug therapy: Secondary | ICD-10-CM | POA: Insufficient documentation

## 2015-07-08 DIAGNOSIS — Z792 Long term (current) use of antibiotics: Secondary | ICD-10-CM | POA: Insufficient documentation

## 2015-07-08 DIAGNOSIS — Z7289 Other problems related to lifestyle: Secondary | ICD-10-CM

## 2015-07-08 DIAGNOSIS — F1020 Alcohol dependence, uncomplicated: Secondary | ICD-10-CM | POA: Insufficient documentation

## 2015-07-08 DIAGNOSIS — F1110 Opioid abuse, uncomplicated: Secondary | ICD-10-CM | POA: Insufficient documentation

## 2015-07-08 DIAGNOSIS — Z87442 Personal history of urinary calculi: Secondary | ICD-10-CM | POA: Insufficient documentation

## 2015-07-08 MED ORDER — ZOLPIDEM TARTRATE 5 MG PO TABS: 5.0000 mg | ORAL_TABLET | Freq: Every evening | ORAL | Status: DC | PRN

## 2015-07-08 MED ORDER — CLONIDINE HCL 0.1 MG PO TABS: 0.1000 mg | ORAL_TABLET | Freq: Three times a day (TID) | ORAL | Status: DC | PRN

## 2015-07-08 MED ORDER — PROMETHAZINE HCL 25 MG RE SUPP: 25.0000 mg | Freq: Four times a day (QID) | RECTAL | Status: DC | PRN

## 2015-07-08 NOTE — Discharge Instructions (Signed)
°Emergency Department Resource Guide °1) Find a Doctor and Pay Out of Pocket °Although you won't have to find out who is covered by your insurance plan, it is a good idea to ask around and get recommendations. You will then need to call the office and see if the doctor you have chosen will accept you as a new patient and what types of options they offer for patients who are self-pay. Some doctors offer discounts or will set up payment plans for their patients who do not have insurance, but you will need to ask so you aren't surprised when you get to your appointment. ° °2) Contact Your Local Health Department °Not all health departments have doctors that can see patients for sick visits, but many do, so it is worth a call to see if yours does. If you don't know where your local health department is, you can check in your phone book. The CDC also has a tool to help you locate your state's health department, and many state websites also have listings of all of their local health departments. ° °3) Find a Walk-in Clinic °If your illness is not likely to be very severe or complicated, you may want to try a walk in clinic. These are popping up all over the country in pharmacies, drugstores, and shopping centers. They're usually staffed by nurse practitioners or physician assistants that have been trained to treat common illnesses and complaints. They're usually fairly quick and inexpensive. However, if you have serious medical issues or chronic medical problems, these are probably not your best option. ° °No Primary Care Doctor: °- Call [facility] at  832-8000 - they can help you locate a primary care doctor that  accepts your insurance, provides certain services, etc. °- Physician Referral Service- 1-800-533-3463 ° °Chronic Pain Problems: °Organization         Address  Phone   Notes  ° [facility]  (336) 297-2271 Patients need to be referred by their primary care doctor.  ° °Medication  Assistance: °Organization         Address  Phone   Notes  °[place] [facility] [address] °[place], [place] [address] (336) 641-8030 --Must be a resident of [place] °-- Must have NO insurance coverage whatsoever (no Medicaid/ Medicare, etc.) °-- The pt. MUST have a primary care doctor that directs their care regularly and follows them in the community °  °[facility]  (866) 331-1348   °[employer]  (888) 892-1162   ° °Agencies that provide inexpensive medical care: °Organization         Address  Phone   Notes  °[facility]  (336) 832-8035   °[facility]    (336) 832-7272   °[facility] [address] °[place],  [address] (336) 832-4777   °[facility] [address], °[place] (336) 271-4999   °[facility]    (336) 373-0678   °[facility]    (336) 272-1050   °[facility] ° [address], [place] Phone:  (336) 832-4444, Fax:  (336) 832-4440 Hours of Operation:  9 am - 6 pm, M-F.  Also accepts Medicaid/Medicare and self-pay.  °[facility] ° [address], [place] Phone: (336) [phone], Fax: (336) [phone]. Hours of Operation:  8:30 am - 5:30 pm, M-F.  Also accepts Medicaid and self-pay.  °[facility] [place] [address], [place] Phone: (336) 878-6027   °[facility] [address], [place], [place] (336)723-1848, Ext. 123 Mondays & Thursdays: 7-9 AM.  First 15 patients are seen on a first come, first serve basis. °  ° °Medicaid-accepting [place] Providers: ° °Organization         Address  Phone   Notes  °[facility] [address], [place] (336) 641-2100 Also accepts self-pay patients.  °[facility] [address], [place] ° (336) 856-9996   °[facility] [address], [place]  (336) 288-8857   °[facility] [address] [place] [address], [place] (336) 299-7000   °Veita Bland [address], [place]  ° (336) 373-1557 Only accepts [place] Access Medicaid patients after they have their name applied to their card.  ° °Self-Pay (no insurance) in [place]: ° °Organization         Address  Phone   Notes  °Sickle Cell Patients, [facility] [address], [place] (336) 832-1970   °[facility] [address], [place] (336) 832-4400   °[facility] ° [address], [place] (336) 992-4800   °[facility]/Dr. Osei-Bonsu ° [address] [place] [address], [place] or [address], [place] (336) [phone] Phone number for both [place] and [place] locations is the same.  °[facility] [address], [place] (336) 299-0000   °[facility] [address] [place] [address], [place] or [address] (336) 852-7530 °(336) 878-2260   °[facility] [address], [place] (336) 350-1642, phone; (336) 294-5005, fax Sees patients 1st and 3rd Saturday of every month.  Must not qualify for public or private insurance (i.e. Medicaid, Medicare, [place] Health Choice, Veterans' Benefits) • Household income should be no more than 200% of the poverty level •The clinic cannot treat you if you are pregnant or think you are pregnant • Sexually transmitted diseases are not treated at the clinic.  ° ° °Dental Care: °Organization         Address  Phone  Notes  °[place] [facility] [address],  (336) 641-6152 Accepts children up to age 21 who are enrolled in Medicaid or [place] Health Choice; pregnant women with a Medicaid card; and children who have applied for Medicaid or [place] Health Choice, but were declined, whose parents can pay a reduced fee at time of service.  °[place]  [facility] [place]  [address], [place] (336) 641-7733 Accepts children up to age 21 who are enrolled in Medicaid or [place] Health Choice; pregnant women with a Medicaid card; and children who have applied for Medicaid or [place] Health Choice, but were declined, whose parents can pay a reduced fee at time of service.  °[facility] ° [address],  (336) 641-4533 Patients are seen by appointment only. Walk-ins are not accepted. [facility] will see patients 18 years of age and older. °Monday - Tuesday (8am-5pm) °Most Wednesdays (8:30-5pm) °$30 per visit, cash only  °[facility] ° [address], [place] (336) 641-4533 Patients are seen by appointment only. Walk-ins are not accepted. [facility] will see patients 18 years of age and older. °One   Wednesday Evening (Monthly: Volunteer Based).  $30 per visit, cash only  °[facility]  (919) 537-3737 for adults; Children under age 4, call Graduate Pediatric Dentistry at (919) [phone]. Children aged 4-14, please call (919) [phone] to request a pediatric application. ° Dental services are provided in all areas of dental care including fillings, crowns and bridges, complete and partial dentures, implants, gum treatment, root canals, and extractions. Preventive care is also provided. Treatment is provided to both adults and children. °Patients are selected via a lottery and there is often a waiting list. °  °[facility] [address], °[place] ° (336) 763-8833 [email or web address] °  °[facility] [address], [place], [place] (336)723-1848, Ext. 123 Second and Fourth Thursday of each month, opens at 6:30 AM; Clinic ends at 9 AM.  Patients are seen on a first-come first-served basis, and a limited number are seen during each clinic.  ° °[facility] ° [address], [place], [place] (336) [phone]    Eligibility Requirements °You must have lived in [place], [place], or [place] counties for at least the last three months. °  You cannot be eligible for state or federal sponsored healthcare insurance, including Veterans Administration, Medicaid, or Medicare. °  You generally cannot be eligible for healthcare insurance through your employer.  °  How to apply: °Eligibility screenings are held every Tuesday and Wednesday afternoon from 1:00 pm until 4:00 pm. You do not need an appointment for the interview!  °[facility] [address], [place], [place] 336-631-2330   °[facility]  336-342-8273   °[facility]  336-703-3100   °[facility]  336-570-6415   ° °Behavioral Health Resources in the Community: °Intensive Outpatient Programs °Organization         Address  Phone  Notes  °[facility] [address], [place], [place] 336-878-6098   °[facility] [address], [place], [place] 336-832-9800   °[facility] [address], [place], [place] ° 336-882-2125   °[facility] [address],  °[place], [place] 1-800-853-5163 or 336-641-4981   °Substance Abuse Resources °Organization         Address  Phone  Notes  °[facility]  336-882-2125   °[facility]  336-784-9470   °[facility]  336-285-9073   °[facility]  336-845-3988   °Residential & Outpatient Substance Abuse Program  1-800-659-3381   °Psychological Services °Organization         Address  Phone  Notes  °[facility]  336- 832-9600   °[facility]  336- 378-7881   °[facility] [address], [place] 1-800-853-5163 or 336-641-4981   ° °Mobile Crisis Teams °Organization         Address  Phone  Notes  °[facility], Mobile Crisis Care Unit  1-877-626-1772   °Assertive °[facility] ° [address]  [place], [place] 336-834-9664   °Sharon DeEsch [address] °[place] 336-554-5454   ° °Self-Help/Support Groups °Organization         Address  Phone             Notes  °[facility] - variety of support groups  336- 373-1402 Call for more information  °Narcotics Anonymous (NA), [facility] [address], °[place]   2 meetings at this location  ° °  Residential Treatment Programs °Organization         Address  Phone  Notes  °[facility] [address],    °[place]  1-866-801-8205   °[facility] ° [address], [place], [place] 704-293-8524   °[facility] [address], [place] 336-845-3988 Admissions: 8am-3pm M-F  °[facility] [address]    °[place], [place] 336-841-1104   °[facility] [address], [place], [place] 336-379-7146   °[facility] [address]  °[place], [place] 336-285-9073   °[facility] - Intensive Outpatient [address], [place], [place] 336-852-3033   °[facility] [address]  °[place], [place] 1-877-615-2722 or 336-784-9470   °[facility] [address] [place], [place] 336-227-7417 Accepts Medicaid  °[facility] [address]  ° [place] 1-800-659-3381 Substance Abuse/Addiction Treatment  ° °[place] Behavioral Health Resources °Organization         Address  Phone  Notes  °[facility]  (888) 581-9988   °Julie Brannon, PhD [address] [place], [place]   (336) 349-5553 or (336) 951-0000   °[facility]   [address] °[place], [place] (336) 349-4454   °[facility] [address], [place], [place] (336) 342-8316 Insurance/Medicaid/sponsorship through [facility]  °[facility] [address]                                    [place], [place] (336) 342-8316 Therapy/tele-psych/case    °[facility] [address]  ° [place],  (336) 349-2233    °Dr. Arfeen  (336) 349-4544   °[facility] [place]  [employer] [place] [facility] 1) [address], [place] °2) [address], [place] °3)  [address], [place] (336) 349-3220 °(336) 342-7768 ° °(336) 342-8140   °[place] Child Abuse Hotline (336) 342-1394 or (336) 342-3537 (After Hours)    ° ° °

## 2015-07-08 NOTE — ED Provider Notes (Signed)
CSN: [id number]     Arrival date & time 07/08/15  1844 History   First MD Initiated Contact with Patient 07/08/15 2007     Chief Complaint  Patient presents with  . Alcohol Intoxication  . Detox from Opiates       HPI Presents to ER complaining of vicodin abuse. Reports he was drinking ETOH today. No SI or HI. No other complaints.    Past Medical History  Diagnosis Date  . Kidney stone    No past surgical history on file. No family history on file. Social History  Substance Use Topics  . Smoking status: Current Every Day Smoker  . Smokeless tobacco: None  . Alcohol Use: Yes     Comment: ocassional    Review of Systems  All other systems reviewed and are negative.     Allergies  Review of patient's allergies indicates no known allergies.  Home Medications   Prior to Admission medications   Medication Sig Start Date End Date Taking? Authorizing Provider  ondansetron (ZOFRAN) 4 MG tablet Take 1 tablet (4 mg total) by mouth every 6 (six) hours. 06/18/12   Fayrene Helper, PA-C  oxyCODONE-acetaminophen (PERCOCET/ROXICET) 5-325 MG per tablet Take 1 tablet by mouth every 6 (six) hours as needed for pain. 06/18/12   Fayrene Helper, PA-C  sulfamethoxazole-trimethoprim (BACTRIM DS,SEPTRA DS) 800-160 MG per tablet Take 1 tablet by mouth 2 (two) times daily. 06/18/12   Fayrene Helper, PA-C  Tamsulosin HCl (FLOMAX) 0.4 MG CAPS Take 1 capsule (0.4 mg total) by mouth daily. 06/18/12   Fayrene Helper, PA-C   BP 120/72 mmHg  Pulse 115  Temp(Src) 98.3 F (36.8 C) (Oral)  Resp 18  SpO2 100% Physical Exam  Constitutional: He is oriented to person, place, and time. He appears well-developed and well-nourished.  HENT:  Head: Normocephalic.  Eyes: EOM are normal.  Neck: Normal range of motion.  Pulmonary/Chest: Effort normal.  Abdominal: He exhibits no distension.  Musculoskeletal: Normal range of motion.  Neurological: He is alert and oriented to person, place, and time.  Psychiatric: He has a  normal mood and affect.  Nursing note and vitals reviewed.   ED Course  Procedures (including critical care time) Labs Review Labs Reviewed - No data to display  Imaging Review No results found. I have personally reviewed and evaluated these images and lab results as part of my medical decision-making.   EKG Interpretation None      MDM   Final diagnoses:  None    The patient presents today with narcotic abuse.  There is no indication for involuntary commitment for inpatient treatment.  I think the patient is best managed as an outpatient for his/her opioid abuse.  The patient will be discharged home with a prescription for clonidine, Ambien, and antiemitics.      Azalia Bilis, MD 07/08/15 2018

## 2015-07-08 NOTE — ED Notes (Signed)
Pt states that he wants detox from opiates.  States that he takes vicodin daily.  States he takes 13 vicodin a day.  Could not get his vicodin today so he drank alcohol.  States that that he only drinks when he cannot get his vicodin.  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  When asked about SI, pt states that he would want to kill himself if he could not get help. Pt acting very "silly" in triage.

## 2019-02-28 ENCOUNTER — Emergency Department (HOSPITAL_COMMUNITY): Payer: Self-pay

## 2019-02-28 ENCOUNTER — Emergency Department (HOSPITAL_COMMUNITY)
Admission: EM | Admit: 2019-02-28 | Discharge: 2019-02-28 | Disposition: A | Discharge status: Home / Self Care | Payer: Self-pay | Attending: Emergency Medicine | Admitting: Emergency Medicine

## 2019-02-28 ENCOUNTER — Encounter (HOSPITAL_COMMUNITY): Payer: Self-pay | Admitting: *Deleted

## 2019-02-28 ENCOUNTER — Other Ambulatory Visit: Payer: Self-pay

## 2019-02-28 DIAGNOSIS — Y999 Unspecified external cause status: Secondary | ICD-10-CM | POA: Insufficient documentation

## 2019-02-28 DIAGNOSIS — S42301A Unspecified fracture of shaft of humerus, right arm, initial encounter for closed fracture: Secondary | ICD-10-CM | POA: Insufficient documentation

## 2019-02-28 DIAGNOSIS — Z79899 Other long term (current) drug therapy: Secondary | ICD-10-CM | POA: Insufficient documentation

## 2019-02-28 DIAGNOSIS — F17210 Nicotine dependence, cigarettes, uncomplicated: Secondary | ICD-10-CM | POA: Insufficient documentation

## 2019-02-28 DIAGNOSIS — Y92239 Unspecified place in hospital as the place of occurrence of the external cause: Secondary | ICD-10-CM | POA: Insufficient documentation

## 2019-02-28 DIAGNOSIS — Y929 Unspecified place or not applicable: Secondary | ICD-10-CM | POA: Insufficient documentation

## 2019-02-28 DIAGNOSIS — Y939 Activity, unspecified: Secondary | ICD-10-CM | POA: Insufficient documentation

## 2019-02-28 MED ORDER — MORPHINE SULFATE (PF) 4 MG/ML IV SOLN
4.0000 mg | Freq: Once | INTRAVENOUS | Status: AC
  Administered 2019-02-28: 4 mg via INTRAVENOUS
  Filled 2019-02-28: qty 1

## 2019-02-28 MED ORDER — SODIUM CHLORIDE 0.9 % IV BOLUS
1000.0000 mL | Freq: Once | INTRAVENOUS | Status: AC
  Administered 2019-02-28: 08:00:00 1000 mL via INTRAVENOUS

## 2019-02-28 MED ORDER — OXYCODONE-ACETAMINOPHEN 5-325 MG PO TABS: 1.0000 | ORAL_TABLET | Freq: Four times a day (QID) | ORAL | 0 refills | Status: DC | PRN

## 2019-02-28 MED ORDER — ONDANSETRON HCL 4 MG/2ML IJ SOLN
4.0000 mg | Freq: Once | INTRAMUSCULAR | Status: AC
  Administered 2019-02-28: 08:00:00 4 mg via INTRAVENOUS
  Filled 2019-02-28: qty 2

## 2019-02-28 NOTE — ED Notes (Signed)
Patient verbalizes understanding of discharge instructions. Opportunity for questioning and answers were provided. Armband removed by staff, pt discharged from ED.  

## 2019-02-28 NOTE — Progress Notes (Signed)
Orthopedic Tech Progress Note Patient Details:  Walter Howe 12/12/1985 [id number]  Ortho Devices Type of Ortho Device: Ace wrap, Coapt, Sling immobilizer Ortho Device/Splint Location: right Ortho Device/Splint Interventions: Application   Post Interventions Patient Tolerated: Well Instructions Provided: Care of device   [name] 02/28/2019, 10:19 AM
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
## 2019-02-28 NOTE — Discharge Instructions (Addendum)
Please follow up with Dr. Phoebe Sharps office tomorrow for further care.

## 2019-03-01 ENCOUNTER — Ambulatory Visit (INDEPENDENT_AMBULATORY_CARE_PROVIDER_SITE_OTHER): Payer: Self-pay | Admitting: Orthopaedic Surgery

## 2019-03-01 ENCOUNTER — Other Ambulatory Visit: Payer: Self-pay

## 2019-03-01 DIAGNOSIS — S42331A Displaced oblique fracture of shaft of humerus, right arm, initial encounter for closed fracture: Secondary | ICD-10-CM
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
## 2019-03-04 ENCOUNTER — Other Ambulatory Visit (HOSPITAL_COMMUNITY)
Admission: RE | Admit: 2019-03-04 | Discharge: 2019-03-04 | Disposition: A | Discharge status: Home / Self Care | Payer: Self-pay | Source: Ambulatory Visit | Attending: Orthopaedic Surgery | Admitting: Orthopaedic Surgery

## 2019-03-04 ENCOUNTER — Encounter (HOSPITAL_COMMUNITY): Payer: Self-pay | Admitting: *Deleted

## 2019-03-04 DIAGNOSIS — Z01812 Encounter for preprocedural laboratory examination: Secondary | ICD-10-CM | POA: Insufficient documentation

## 2019-03-04 DIAGNOSIS — Z20828 Contact with and (suspected) exposure to other viral communicable diseases: Secondary | ICD-10-CM | POA: Insufficient documentation

## 2019-03-04 LAB — SARS CORONAVIRUS 2 (TAT 6-24 HRS): SARS Coronavirus 2: NEGATIVE

## 2019-03-05 ENCOUNTER — Encounter (HOSPITAL_COMMUNITY): Payer: Self-pay | Admitting: *Deleted

## 2019-03-05 NOTE — Progress Notes (Signed)
Walter Howe denies chest pain or shortness of breath. Patient  tested  negative Covid on 03/04/2019 and has been in quarantine since that time. I instructed patient to not eat anything after midnight. I told patient clear liquids are allowed until__1000__. Patient and I went over what clear liquids are. Walter Howe stated that he does nothave anyone to pick up Pre-Surgery Ensure, I encouraged patient to drink Gatorade along withtany other clear liquids. Patient voiced understanding.

## 2019-03-07 ENCOUNTER — Encounter (HOSPITAL_COMMUNITY): Payer: Self-pay | Admitting: Certified Registered Nurse Anesthetist

## 2019-03-07 ENCOUNTER — Encounter (HOSPITAL_COMMUNITY)
Admission: RE | Disposition: A | Discharge status: Home / Self Care | Payer: Self-pay | Source: Home / Self Care | Attending: Orthopaedic Surgery

## 2019-03-07 ENCOUNTER — Ambulatory Visit (HOSPITAL_COMMUNITY)
Admission: RE | Admit: 2019-03-07 | Discharge: 2019-03-07 | Disposition: A | Discharge status: Home / Self Care | Payer: Self-pay | Attending: Orthopaedic Surgery | Admitting: Orthopaedic Surgery

## 2019-03-07 ENCOUNTER — Ambulatory Visit (HOSPITAL_COMMUNITY): Payer: Self-pay

## 2019-03-07 ENCOUNTER — Other Ambulatory Visit: Payer: Self-pay

## 2019-03-07 ENCOUNTER — Ambulatory Visit (HOSPITAL_COMMUNITY): Payer: Self-pay | Admitting: Certified Registered Nurse Anesthetist

## 2019-03-07 DIAGNOSIS — Z87442 Personal history of urinary calculi: Secondary | ICD-10-CM | POA: Insufficient documentation

## 2019-03-07 DIAGNOSIS — S42341A Displaced spiral fracture of shaft of humerus, right arm, initial encounter for closed fracture: Secondary | ICD-10-CM | POA: Insufficient documentation

## 2019-03-07 DIAGNOSIS — Z419 Encounter for procedure for purposes other than remedying health state, unspecified: Secondary | ICD-10-CM

## 2019-03-07 DIAGNOSIS — Z79899 Other long term (current) drug therapy: Secondary | ICD-10-CM | POA: Insufficient documentation

## 2019-03-07 DIAGNOSIS — Z87891 Personal history of nicotine dependence: Secondary | ICD-10-CM | POA: Insufficient documentation

## 2019-03-07 DIAGNOSIS — X58XXXA Exposure to other specified factors, initial encounter: Secondary | ICD-10-CM | POA: Insufficient documentation

## 2019-03-07 DIAGNOSIS — S42331A Displaced oblique fracture of shaft of humerus, right arm, initial encounter for closed fracture: Secondary | ICD-10-CM | POA: Diagnosis present

## 2019-03-07 HISTORY — DX: Anxiety disorder, unspecified: F41.9

## 2019-03-07 HISTORY — DX: Personal history of urinary calculi: Z87.442

## 2019-03-07 HISTORY — PX: ORIF HUMERUS FRACTURE: SHX2126

## 2019-03-07 LAB — BASIC METABOLIC PANEL
Anion gap: 13 (ref 5–15)
BUN: 15 mg/dL (ref 6–20)
CO2: 21 mmol/L — ABNORMAL LOW (ref 22–32)
Calcium: 9.2 mg/dL (ref 8.9–10.3)
Chloride: 103 mmol/L (ref 98–111)
Creatinine, Ser: 0.69 mg/dL (ref 0.61–1.24)
GFR calc Af Amer: >60 mL/min (ref >60)
GFR calc non Af Amer: >60 mL/min (ref >60)
Glucose, Bld: 104 mg/dL — ABNORMAL HIGH (ref 70–99)
Potassium: 4.3 mmol/L (ref 3.5–5.1)
Sodium: 137 mmol/L (ref 135–145)

## 2019-03-07 LAB — HEMOGLOBIN: Hemoglobin: 14.5 g/dL (ref 13.0–17.0)

## 2019-03-07 SURGERY — OPEN REDUCTION INTERNAL FIXATION (ORIF) DISTAL HUMERUS FRACTURE
Anesthesia: Regional | Site: Arm Upper | Laterality: Right

## 2019-03-07 MED ORDER — LIDOCAINE 2% (20 MG/ML) 5 ML SYRINGE
INTRAMUSCULAR | Status: AC
  Filled 2019-03-07: qty 5

## 2019-03-07 MED ORDER — MIDAZOLAM HCL 2 MG/2ML IJ SOLN
INTRAMUSCULAR | Status: AC
  Administered 2019-03-07: 2 mg via INTRAVENOUS
  Filled 2019-03-07: qty 2

## 2019-03-07 MED ORDER — ONDANSETRON HCL 4 MG PO TABS: 4.0000 mg | ORAL_TABLET | Freq: Three times a day (TID) | ORAL | 0 refills | Status: DC | PRN

## 2019-03-07 MED ORDER — CHLORHEXIDINE GLUCONATE 4 % EX LIQD: 60.0000 mL | Freq: Once | CUTANEOUS | Status: DC

## 2019-03-07 MED ORDER — ONDANSETRON HCL 4 MG/2ML IJ SOLN
INTRAMUSCULAR | Status: AC
  Filled 2019-03-07: qty 2

## 2019-03-07 MED ORDER — LACTATED RINGERS IV SOLN
INTRAVENOUS | Status: DC
  Administered 2019-03-07: 12:00:00 via INTRAVENOUS

## 2019-03-07 MED ORDER — MIDAZOLAM HCL 5 MG/5ML IJ SOLN
INTRAMUSCULAR | Status: DC | PRN
  Administered 2019-03-07: 2 mg via INTRAVENOUS

## 2019-03-07 MED ORDER — BUPIVACAINE-EPINEPHRINE (PF) 0.5% -1:200000 IJ SOLN
INTRAMUSCULAR | Status: DC | PRN
  Administered 2019-03-07: 30 mL via PERINEURAL

## 2019-03-07 MED ORDER — DEXMEDETOMIDINE HCL IN NACL 80 MCG/20ML IV SOLN
INTRAVENOUS | Status: AC
  Filled 2019-03-07: qty 20

## 2019-03-07 MED ORDER — CEFAZOLIN SODIUM-DEXTROSE 2-4 GM/100ML-% IV SOLN
INTRAVENOUS | Status: AC
  Filled 2019-03-07: qty 100

## 2019-03-07 MED ORDER — PROPOFOL 500 MG/50ML IV EMUL
INTRAVENOUS | Status: DC | PRN
  Administered 2019-03-07: 125 ug/kg/min via INTRAVENOUS

## 2019-03-07 MED ORDER — HYDROMORPHONE HCL 1 MG/ML IJ SOLN: 0.2500 mg | INTRAMUSCULAR | Status: DC | PRN

## 2019-03-07 MED ORDER — OXYCODONE-ACETAMINOPHEN 5-325 MG PO TABS: 1.0000 | ORAL_TABLET | Freq: Three times a day (TID) | ORAL | 0 refills | Status: DC | PRN

## 2019-03-07 MED ORDER — FENTANYL CITRATE (PF) 250 MCG/5ML IJ SOLN
INTRAMUSCULAR | Status: AC
  Filled 2019-03-07: qty 5

## 2019-03-07 MED ORDER — LACTATED RINGERS IV SOLN
INTRAVENOUS | Status: DC | PRN
  Administered 2019-03-07 (×2): via INTRAVENOUS

## 2019-03-07 MED ORDER — MIDAZOLAM HCL 2 MG/2ML IJ SOLN
2.0000 mg | Freq: Once | INTRAMUSCULAR | Status: AC
  Administered 2019-03-07: 12:00:00 2 mg via INTRAVENOUS

## 2019-03-07 MED ORDER — FENTANYL CITRATE (PF) 100 MCG/2ML IJ SOLN
INTRAMUSCULAR | Status: AC
  Administered 2019-03-07: 100 ug via INTRAVENOUS
  Filled 2019-03-07: qty 2

## 2019-03-07 MED ORDER — MIDAZOLAM HCL 2 MG/2ML IJ SOLN
INTRAMUSCULAR | Status: AC
  Filled 2019-03-07: qty 2

## 2019-03-07 MED ORDER — ONDANSETRON HCL 4 MG/2ML IJ SOLN: 4.0000 mg | Freq: Once | INTRAMUSCULAR | Status: DC | PRN

## 2019-03-07 MED ORDER — DEXAMETHASONE SODIUM PHOSPHATE 10 MG/ML IJ SOLN
INTRAMUSCULAR | Status: AC
  Filled 2019-03-07: qty 1

## 2019-03-07 MED ORDER — CEFAZOLIN SODIUM-DEXTROSE 2-4 GM/100ML-% IV SOLN
2.0000 g | INTRAVENOUS | Status: AC
  Administered 2019-03-07: 2 g via INTRAVENOUS

## 2019-03-07 MED ORDER — NAPROXEN 500 MG PO TABS: 500.0000 mg | ORAL_TABLET | Freq: Two times a day (BID) | ORAL | 3 refills | Status: DC

## 2019-03-07 MED ORDER — 0.9 % SODIUM CHLORIDE (POUR BTL) OPTIME
TOPICAL | Status: DC | PRN
  Administered 2019-03-07 (×3): 1000 mL

## 2019-03-07 MED ORDER — ONDANSETRON HCL 4 MG/2ML IJ SOLN
INTRAMUSCULAR | Status: DC | PRN
  Administered 2019-03-07: 4 mg via INTRAVENOUS

## 2019-03-07 MED ORDER — PROPOFOL 10 MG/ML IV BOLUS
INTRAVENOUS | Status: DC | PRN
  Administered 2019-03-07: 30 mg via INTRAVENOUS

## 2019-03-07 MED ORDER — SODIUM CHLORIDE 0.9 % IV SOLN
INTRAVENOUS | Status: DC | PRN
  Administered 2019-03-07: 40 ug/min via INTRAVENOUS

## 2019-03-07 MED ORDER — PROPOFOL 10 MG/ML IV BOLUS
INTRAVENOUS | Status: AC
  Filled 2019-03-07: qty 40

## 2019-03-07 MED ORDER — MEPERIDINE HCL 25 MG/ML IJ SOLN: 6.2500 mg | INTRAMUSCULAR | Status: DC | PRN

## 2019-03-07 MED ORDER — BUPIVACAINE HCL (PF) 0.25 % IJ SOLN
INTRAMUSCULAR | Status: AC
  Filled 2019-03-07: qty 30

## 2019-03-07 MED ORDER — PROPOFOL 500 MG/50ML IV EMUL: INTRAVENOUS | Status: DC | PRN

## 2019-03-07 MED ORDER — PHENYLEPHRINE HCL (PRESSORS) 10 MG/ML IV SOLN
INTRAVENOUS | Status: DC | PRN
  Administered 2019-03-07: 40 ug via INTRAVENOUS
  Administered 2019-03-07: 60 ug via INTRAVENOUS

## 2019-03-07 MED ORDER — FENTANYL CITRATE (PF) 100 MCG/2ML IJ SOLN
100.0000 ug | Freq: Once | INTRAMUSCULAR | Status: AC
  Administered 2019-03-07: 12:00:00 100 ug via INTRAVENOUS

## 2019-03-07 SURGICAL SUPPLY — 69 items
BIT DRILL OVR 3.5AO QC SHRT SM (DRILL) IMPLANT
BIT DRILL QC 2.5MM SHRT EVO SM (DRILL) IMPLANT
BNDG CMPR 9X4 STRL LF SNTH (GAUZE/BANDAGES/DRESSINGS)
BNDG ESMARK 4X9 LF (GAUZE/BANDAGES/DRESSINGS) IMPLANT
CANISTER SUCT 3000ML PPV (MISCELLANEOUS) IMPLANT
CLOSURE WOUND 1/2 X4 (GAUZE/BANDAGES/DRESSINGS) ×1
COVER SURGICAL LIGHT HANDLE (MISCELLANEOUS) ×3 IMPLANT
COVER WAND RF STERILE (DRAPES) ×3 IMPLANT
CUFF TOURN SGL QUICK 18X4 (TOURNIQUET CUFF) ×3 IMPLANT
CUFF TOURN SGL QUICK 24 (TOURNIQUET CUFF)
CUFF TRNQT CYL 24X4X16.5-23 (TOURNIQUET CUFF) IMPLANT
DRAPE C-ARM 42X72 X-RAY (DRAPES) ×5 IMPLANT
DRAPE IMP U-DRAPE 54X76 (DRAPES) ×3 IMPLANT
DRAPE INCISE IOBAN 66X45 STRL (DRAPES) ×3 IMPLANT
DRAPE U-SHAPE 47X51 STRL (DRAPES) ×3 IMPLANT
DRILL OVER 3.5 AO QC SHORT SM (DRILL) ×3
DRILL QC 2.5MM SHORT EVOS SM (DRILL) ×3
DRSG TEGADERM 4X4.75 (GAUZE/BANDAGES/DRESSINGS) ×6 IMPLANT
DURAPREP 26ML APPLICATOR (WOUND CARE) ×4 IMPLANT
ELECT CAUTERY BLADE 6.4 (BLADE) ×3 IMPLANT
ELECT REM PT RETURN 9FT ADLT (ELECTROSURGICAL) ×3
ELECTRODE REM PT RTRN 9FT ADLT (ELECTROSURGICAL) ×1 IMPLANT
FACESHIELD WRAPAROUND (MASK) ×3 IMPLANT
FACESHIELD WRAPAROUND OR TEAM (MASK) ×1 IMPLANT
GAUZE SPONGE 4X4 12PLY STRL (GAUZE/BANDAGES/DRESSINGS) ×3 IMPLANT
GAUZE XEROFORM 1X8 LF (GAUZE/BANDAGES/DRESSINGS) ×3 IMPLANT
GAUZE XEROFORM 5X9 LF (GAUZE/BANDAGES/DRESSINGS) ×3 IMPLANT
GLOVE BIOGEL PI IND STRL 7.0 (GLOVE) ×1 IMPLANT
GLOVE BIOGEL PI INDICATOR 7.0 (GLOVE) ×6
GLOVE ECLIPSE 7.0 STRL STRAW (GLOVE) ×7 IMPLANT
GLOVE SKINSENSE NS SZ7.5 (GLOVE) ×4
GLOVE SKINSENSE STRL SZ7.5 (GLOVE) ×4 IMPLANT
GLOVE SURG SYN 7.5  E (GLOVE) ×8
GLOVE SURG SYN 7.5 E (GLOVE) ×4 IMPLANT
GLOVE SURG SYN 7.5 PF PI (GLOVE) IMPLANT
GOWN STRL REIN XL XLG (GOWN DISPOSABLE) ×6 IMPLANT
KIT BASIN OR (CUSTOM PROCEDURE TRAY) ×3 IMPLANT
KIT TURNOVER KIT B (KITS) ×3 IMPLANT
LOOP VESSEL MAXI BLUE (MISCELLANEOUS) ×2 IMPLANT
MANIFOLD NEPTUNE II (INSTRUMENTS) ×3 IMPLANT
NS IRRIG 1000ML POUR BTL (IV SOLUTION) ×9 IMPLANT
PACK SHOULDER (CUSTOM PROCEDURE TRAY) ×3 IMPLANT
PACK UNIVERSAL I (CUSTOM PROCEDURE TRAY) ×3 IMPLANT
PAD ARMBOARD 7.5X6 YLW CONV (MISCELLANEOUS) ×6 IMPLANT
PAD CAST 4YDX4 CTTN HI CHSV (CAST SUPPLIES) ×1 IMPLANT
PADDING CAST COTTON 4X4 STRL (CAST SUPPLIES) ×3
PLATE LOCK EVOS 3.5X116 10H (Plate) ×2 IMPLANT
SCREW CORT 3.5X20 ST EVOS (Screw) ×9 IMPLANT
SCREW CORT 3.5X22 ST EVOS (Screw) ×4 IMPLANT
SCREW CORTEX 3.5X18 EVOS (Screw) ×4 IMPLANT
SCREW CORTEX 3.5X24MM (Screw) ×3 IMPLANT
SCREW CORTEX 3.5X26 (Screw) IMPLANT
SLING ARM IMMOBILIZER LRG (SOFTGOODS) ×3 IMPLANT
SPONGE LAP 18X18 RF (DISPOSABLE) ×8 IMPLANT
STAPLER VISISTAT 35W (STAPLE) IMPLANT
STRIP CLOSURE SKIN 1/2X4 (GAUZE/BANDAGES/DRESSINGS) ×1 IMPLANT
SUCTION FRAZIER HANDLE 10FR (MISCELLANEOUS) ×2
SUCTION TUBE FRAZIER 10FR DISP (MISCELLANEOUS) IMPLANT
SUT ETHILON 3 0 PS 1 (SUTURE) ×6 IMPLANT
SUT MNCRL AB 4-0 PS2 18 (SUTURE) ×2 IMPLANT
SUT VIC AB 0 CT1 27 (SUTURE) ×3
SUT VIC AB 0 CT1 27XBRD ANBCTR (SUTURE) ×1 IMPLANT
SUT VIC AB 2-0 CT1 27 (SUTURE) ×15
SUT VIC AB 2-0 CT1 TAPERPNT 27 (SUTURE) ×1 IMPLANT
SYR CONTROL 10ML LL (SYRINGE) IMPLANT
TOWEL GREEN STERILE (TOWEL DISPOSABLE) ×3 IMPLANT
TOWEL GREEN STERILE FF (TOWEL DISPOSABLE) IMPLANT
UNDERPAD 30X30 (UNDERPADS AND DIAPERS) ×3 IMPLANT
WATER STERILE IRR 1000ML POUR (IV SOLUTION) ×3 IMPLANT

## 2019-03-07 NOTE — Anesthesia Postprocedure Evaluation (Signed)
Anesthesia Post Note  Patient: Walter Howe  Procedure(s) Performed: OPEN REDUCTION INTERNAL FIXATION (ORIF) RIGHT HUMERUS (Right Arm Upper)     Patient location during evaluation: PACU Anesthesia Type: Regional Level of consciousness: awake and alert and patient cooperative Pain management: pain level controlled Vital Signs Assessment: post-procedure vital signs reviewed and stable Respiratory status: spontaneous breathing and respiratory function stable Cardiovascular status: stable Anesthetic complications: no    Last Vitals:  Vitals:   03/07/19 1210 03/07/19 1538  BP: 111/60 100/60  Pulse: (!) 107 81  Resp: 13 15  Temp:  36.6 C  SpO2: 99% 100%    Last Pain:  Vitals:   03/07/19 1538  TempSrc:   PainSc: 0-No pain                 Francee Setzer DAVID

## 2019-03-07 NOTE — Anesthesia Procedure Notes (Signed)
Procedure Name: MAC Date/Time: 03/07/2019 1:02 PM Performed by: Alain Marion, CRNA Pre-anesthesia Checklist: Patient identified, Emergency Drugs available, Suction available, Patient being monitored and Timeout performed Oxygen Delivery Method: Simple face mask Placement Confirmation: positive ETCO2

## 2019-03-07 NOTE — Transfer of Care (Signed)
Immediate Anesthesia Transfer of Care Note  Patient: Walter Howe  Procedure(s) Performed: OPEN REDUCTION INTERNAL FIXATION (ORIF) RIGHT HUMERUS (Right Arm Upper)  Patient Location: PACU  Anesthesia Type:MAC combined with regional for post-op pain  Level of Consciousness: awake, alert  and oriented  Airway & Oxygen Therapy: Patient Spontanous Breathing and Patient connected to face mask oxygen  Post-op Assessment: Report given to RN and Post -op Vital signs reviewed and stable  Post vital signs: Reviewed and stable  Last Vitals:  Vitals Value Taken Time  BP 100/60 03/07/19 1538  Temp 36.6 C 03/07/19 1538  Pulse 99 03/07/19 1543  Resp 13 03/07/19 1543  SpO2 100 % 03/07/19 1543  Vitals shown include unvalidated device data.  Last Pain:  Vitals:   03/07/19 1538  TempSrc:   PainSc: (P) 0-No pain         Complications: No apparent anesthesia complications

## 2019-03-07 NOTE — Anesthesia Procedure Notes (Signed)
Anesthesia Regional Block: Supraclavicular block   Pre-Anesthetic Checklist: ,, timeout performed, Correct Patient, Correct Site, Correct Laterality, Correct Procedure, Correct Position, site marked, Risks and benefits discussed,  Surgical consent,  Pre-op evaluation,  At surgeon's request and post-op pain management  Laterality: Right  Prep: chloraprep       Needles:   Needle Type: Echogenic Stimulator Needle     Needle Length: 9cm  Needle Gauge: 21     Additional Needles:   Procedures:, nerve stimulator,,,,,,,   Nerve Stimulator or Paresthesia:  Response: 0.4 mA,   Additional Responses:   Narrative:  Start time: 03/07/2019 11:50 AM End time: 03/07/2019 12:00 PM Injection made incrementally with aspirations every 5 mL.  Performed by: Personally  Anesthesiologist: Lillia Abed, MD  Additional Notes: Monitors applied. Patient sedated. Sterile prep and drape,hand hygiene and sterile gloves were used. Relevant anatomy identified.Needle position confirmed.Local anesthetic injected incrementally after negative aspiration. Local anesthetic spread visualized around nerve(s). Vascular puncture avoided. No complications. Image printed for medical record.The patient tolerated the procedure well.

## 2019-03-07 NOTE — Anesthesia Preprocedure Evaluation (Signed)
Anesthesia Evaluation  Patient identified by MRN, date of birth, ID band Patient awake    Reviewed: Allergy & Precautions, NPO status , Patient's Chart, lab work & pertinent test results  Airway Mallampati: I  TM Distance: >3 FB Neck ROM: Full    Dental   Pulmonary Patient abstained from smoking., former smoker,    Pulmonary exam normal        Cardiovascular Normal cardiovascular exam     Neuro/Psych Anxiety    GI/Hepatic   Endo/Other    Renal/GU      Musculoskeletal   Abdominal   Peds  Hematology   Anesthesia Other Findings   Reproductive/Obstetrics                             Anesthesia Physical Anesthesia Plan  ASA: II  Anesthesia Plan: Regional   Post-op Pain Management:    Induction: Intravenous  PONV Risk Score and Plan: 1 and Ondansetron  Airway Management Planned: Simple Face Mask  Additional Equipment:   Intra-op Plan:   Post-operative Plan: Extubation in OR  Informed Consent: I have reviewed the patients History and Physical, chart, labs and discussed the procedure including the risks, benefits and alternatives for the proposed anesthesia with the patient or authorized representative who has indicated his/her understanding and acceptance.       Plan Discussed with: CRNA and Surgeon  Anesthesia Plan Comments:         Anesthesia Quick Evaluation

## 2019-03-07 NOTE — H&P (Signed)
PREOPERATIVE H&P  Chief Complaint: right humerus fracture  HPI: Walter Howe is a 33 y.o. male who presents for surgical treatment of right humerus fracture.  He denies any changes in medical history.  Past Medical History:  Diagnosis Date  . Anxiety    03/05/2019- in the past, not current  . History of kidney stones    passed   Past Surgical History:  Procedure Laterality Date  . NO PAST SURGERIES     Social History   Socioeconomic History  . Marital status: Single    Spouse name: Not on file  . Number of children: Not on file  . Years of education: Not on file  . Highest education level: Not on file  Occupational History  . Not on file  Social Needs  . Financial resource strain: Not on file  . Food insecurity    Worry: Not on file    Inability: Not on file  . Transportation needs    Medical: Not on file    Non-medical: Not on file  Tobacco Use  . Smoking status: Former Smoker    Quit date: 02/20/2019    Years since quitting: 0.0  . Smokeless tobacco: Never Used  Substance and Sexual Activity  . Alcohol use: Yes    Comment: ocassional  . Drug use: Yes    Comment: vicodin  . Sexual activity: Not on file  Lifestyle  . Physical activity    Days per week: Not on file    Minutes per session: Not on file  . Stress: Not on file  Relationships  . Social Herbalist on phone: Not on file    Gets together: Not on file    Attends religious service: Not on file    Active member of club or organization: Not on file    Attends meetings of clubs or organizations: Not on file    Relationship status: Not on file  Other Topics Concern  . Not on file  Social History Narrative  . Not on file   History reviewed. No pertinent family history. No Known Allergies Prior to Admission medications   Medication Sig Start Date End Date Taking? Authorizing Provider  cloNIDine (CATAPRES) 0.1 MG tablet Take 1 tablet (0.1 mg total) by mouth every 8 (eight) hours as  needed (withdrawl symptoms). 07/08/15   Jola Schmidt, MD  oxyCODONE-acetaminophen (PERCOCET) 5-325 MG tablet Take 1 tablet by mouth every 6 (six) hours as needed for moderate pain. 02/28/19   Domenic Moras, PA-C  promethazine (PHENERGAN) 25 MG suppository Place 1 suppository (25 mg total) rectally every 6 (six) hours as needed for nausea or vomiting. 07/08/15   Jola Schmidt, MD  zolpidem (AMBIEN) 5 MG tablet Take 1 tablet (5 mg total) by mouth at bedtime as needed for sleep. 07/08/15   Jola Schmidt, MD     Positive ROS: All other systems have been reviewed and were otherwise negative with the exception of those mentioned in the HPI and as above.  Physical Exam: General: Alert, no acute distress Cardiovascular: No pedal edema Respiratory: No cyanosis, no use of accessory musculature GI: abdomen soft Skin: No lesions in the area of chief complaint Neurologic: Sensation intact distally Psychiatric: Patient is competent for consent with normal mood and affect Lymphatic: no lymphedema  MUSCULOSKELETAL: exam stable  Assessment: right humerus fracture  Plan: Plan for Procedure(s): OPEN REDUCTION INTERNAL FIXATION (ORIF) RIGHT HUMERUS  The risks benefits and alternatives were discussed with the patient  including but not limited to the risks of nonoperative treatment, versus surgical intervention including infection, bleeding, nerve injury,  blood clots, cardiopulmonary complications, morbidity, mortality, among others, and they were willing to proceed.   Glee Arvin, MD   03/07/2019 7:46 AM

## 2019-03-07 NOTE — Discharge Instructions (Signed)

## 2019-03-07 NOTE — Op Note (Signed)
Date of Surgery: 03/07/2019  INDICATIONS: Mr. Nou is a 33 y.o.-year-old adult with a right humeral shaft fracture;  The patient did consent to the procedure after discussion of the risks and benefits.  PREOPERATIVE DIAGNOSIS: Right displaced humeral shaft fracture  POSTOPERATIVE DIAGNOSIS: Same.  PROCEDURE:  1. Open reduction internal fixation right humeral shaft fracture 2.  Neurolysis of right radial nerve  SURGEON: N. Eduard Roux, M.D.  ASSIST: Ciro Backer [place], [place]; necessary for the timely completion of procedure and due to complexity of procedure.  ANESTHESIA:  Supraclavicular block  IV FLUIDS AND URINE: See anesthesia.  ESTIMATED BLOOD LOSS: 150 mL.  IMPLANTS: [employer] 3.5 mm 10 hole locking compression plate  DRAINS: none  COMPLICATIONS: see description of procedure.  DESCRIPTION OF PROCEDURE: The patient was brought to the operating room and placed supine on the operating table.  The patient had been signed prior to the procedure and this was documented. The patient had the anesthesia placed by the anesthesiologist.  A time-out was performed to confirm that this was the correct patient, site, side and location. The patient did receive antibiotics prior to the incision and was re-dosed during the procedure as needed at indicated intervals.  The patient had the operative extremity prepped and draped in the standard surgical fashion.    An incision was made on the anterolateral aspect of the upper arm.  Dissection was carried down through the subcutaneous tissue.  Hemostasis was obtained.  An anterolateral approach to the upper arm was utilized.  The biceps muscle belly was elevated off of the brachialis and retracted medially.  The interval between the brachioradialis and brachialis muscle was developed and the radial nerve was found in its expected anatomic position.  The radial nerve was then carefully neurolysed and traced proximally.  A vessel loop was  loosely tied around the radial nerve.  The brachialis muscle was then elevated off of the anterior aspect of the humeral shaft and the fracture was exposed.  The proximal spike of the distal fracture fragment was directly poking into the radial nerve.  Neurolysis of the radial nerve was continued proximally and the fracture spike was moved off of the radial nerve.  With the radial nerve completely neurolysed and mobilized we turned our attention to the fracture fixation.  Organized hematoma and entrapped periosteum were removed from the fracture site.  The fracture was then reduced using traction and rotation.  The long oblique fracture pattern reduced well.  We then placed a locking compression plate on the anterior aspect of the humeral shaft.  We put a small bend in the plate in order to fit the contour of the humeral shaft.  The fracture was reduced with the plate on the anterior aspect.  2 anterior to posterior lag screws were placed through the plate and across the fracture with excellent compression across the fracture.  Of note the bone quality was excellent.  We then placed 3 additional nonlocking screws proximal to the fracture and 3 nonlocking screws distal to the fracture.  Care was taken when drilling through the proximal holes not the plunge into the radial nerve.  Final fluoroscopic x-rays were taken to confirm fracture reduction and placement of the hardware.  The wound was then thoroughly irrigated with normal saline.  Layered closure was performed.  Sterile dressings were applied.  He was placed in a shoulder sling.  Patient tolerated procedure well had no immediate complication.  POSTOPERATIVE PLAN: Discharge home and follow-up in 1  week for repeat x-rays and initiation of range of motion.  Mayra Reel, MD 3:03 PM

## 2019-03-08 ENCOUNTER — Encounter (HOSPITAL_COMMUNITY): Payer: Self-pay | Admitting: Orthopaedic Surgery

## 2019-03-13 ENCOUNTER — Ambulatory Visit: Payer: Self-pay

## 2019-03-13 ENCOUNTER — Ambulatory Visit (INDEPENDENT_AMBULATORY_CARE_PROVIDER_SITE_OTHER): Payer: Self-pay | Admitting: Physician Assistant

## 2019-03-13 ENCOUNTER — Encounter: Payer: Self-pay | Admitting: Orthopaedic Surgery

## 2019-03-13 ENCOUNTER — Ambulatory Visit (INDEPENDENT_AMBULATORY_CARE_PROVIDER_SITE_OTHER): Payer: Self-pay

## 2019-03-13 ENCOUNTER — Other Ambulatory Visit: Payer: Self-pay

## 2019-03-13 VITALS — Ht 66.0 in | Wt 115.0 lb

## 2019-03-13 DIAGNOSIS — S42331A Displaced oblique fracture of shaft of humerus, right arm, initial encounter for closed fracture: Secondary | ICD-10-CM

## 2019-03-13 MED ORDER — HYDROCODONE-ACETAMINOPHEN 5-325 MG PO TABS: 1.0000 | ORAL_TABLET | Freq: Two times a day (BID) | ORAL | 0 refills | Status: DC | PRN

## 2019-03-13 NOTE — Progress Notes (Signed)
   Post-Op Visit Note   Patient: Walter Howe           Date of Birth: 1985/11/20           MRN: [id number] Visit Date: 03/13/2019 PCP: Default, Provider, MD   Assessment & Plan:  Chief Complaint:  Chief Complaint  Patient presents with  . Right Elbow - Pain   Visit Diagnoses:  1. Closed displaced oblique fracture of shaft of right humerus, initial encounter     Plan: Patient is a pleasant 33 year old who comes in today 6 days status post ORIF right humerus fracture, date of surgery 03/07/2019.  He has been doing well.  His pain has been tolerable with oxycodone.  No fevers or chills.  He has been somewhat compliant wearing his sling.  Examination of his right arm reveals a well-healing surgical incision without evidence of infection or cellulitis.  He does have moderate swelling.  He still exhibits a radial nerve palsy as he is unable to extend his wrist.  He has full sensation distally.  At this point, we will have him continue wearing a sling for another 2 weeks.  He may come out of this for pendulum swings.  We will refer him to hand PT for a custom cock-up splint for his radial nerve palsy.  He will follow-up with [place] in 2 weeks time for repeat evaluation and 2 view x-rays of the right humerus.  Call with concerns or questions in meantime.  Follow-Up Instructions: Return in about 2 weeks (around 03/27/2019).   Orders:  Orders Placed This Encounter  Procedures  . XR Humerus Right   Meds ordered this encounter  Medications  . HYDROcodone-acetaminophen (NORCO) 5-325 MG tablet    Sig: Take 1-2 tablets by mouth 2 (two) times daily as needed for moderate pain.    Dispense:  20 tablet    Refill:  0    Imaging: Xr Humerus Right  Result Date: 03/13/2019 X-rays demonstrate stable fixation of the fracture   PMFS History: Patient Active Problem List   Diagnosis Date Noted  . Closed displaced oblique fracture of shaft of right humerus 03/01/2019  . FINGER PAIN 08/28/2007  .  FRACTURE, FINGER, PROXIMAL 08/28/2007   Past Medical History:  Diagnosis Date  . Anxiety    03/05/2019- in the past, not current  . History of kidney stones    passed    History reviewed. No pertinent family history.  Past Surgical History:  Procedure Laterality Date  . NO PAST SURGERIES    . ORIF HUMERUS FRACTURE Right 03/07/2019   Procedure: OPEN REDUCTION INTERNAL FIXATION (ORIF) RIGHT HUMERUS;  Surgeon: Leandrew Koyanagi, MD;  Location: [facility];  Service: Orthopedics;  Laterality: Right;   Social History   Occupational History  . Not on file  Tobacco Use  . Smoking status: Former Smoker    Quit date: 02/20/2019    Years since quitting: 0.0  . Smokeless tobacco: Never Used  Substance and Sexual Activity  . Alcohol use: Yes    Comment: ocassional  . Drug use: Yes    Comment: vicodin  . Sexual activity: Not on file

## 2019-03-27 ENCOUNTER — Other Ambulatory Visit: Payer: Self-pay

## 2019-03-27 ENCOUNTER — Encounter: Payer: Self-pay | Admitting: Orthopaedic Surgery

## 2019-03-27 ENCOUNTER — Ambulatory Visit (INDEPENDENT_AMBULATORY_CARE_PROVIDER_SITE_OTHER): Payer: Self-pay | Admitting: Orthopaedic Surgery

## 2019-03-27 ENCOUNTER — Ambulatory Visit (INDEPENDENT_AMBULATORY_CARE_PROVIDER_SITE_OTHER): Payer: Self-pay

## 2019-03-27 VITALS — Ht 66.0 in | Wt 115.0 lb

## 2019-03-27 DIAGNOSIS — S42331A Displaced oblique fracture of shaft of humerus, right arm, initial encounter for closed fracture: Secondary | ICD-10-CM

## 2019-03-27 NOTE — Progress Notes (Signed)
   Post-Op Visit Note   Patient: Walter Howe           Date of Birth: 03-01-1986           MRN: [id number] Visit Date: 03/27/2019 PCP: Default, Provider, MD   Assessment & Plan:  Chief Complaint:  Chief Complaint  Patient presents with  . Arm Pain    ORIF right humerus DOS 03/07/2019   Visit Diagnoses:  1. Closed displaced oblique fracture of shaft of right humerus, initial encounter     Plan: Walter Howe is 3 weeks status post ORIF right humerus shaft fracture.  He follows up today.  He continues to have the radial nerve palsy.  His surgical incision is healed.  He has minimal swelling.  His x-rays demonstrate stable fixation alignment of the fracture without complication of the hardware.  At this point he should begin OT for range of motion of the shoulder elbow and wrist fingers.  I demonstrated exercises for him to do at home to prevent stiffness.  He will also need a splint for his radial nerve palsy.  I would like to recheck him in 3 weeks with two-view x-rays of the right humerus.  Follow-Up Instructions: Return in about 3 weeks (around 04/17/2019).   Orders:  Orders Placed This Encounter  Procedures  . XR Humerus Right   No orders of the defined types were placed in this encounter.   Imaging: Xr Humerus Right  Result Date: 03/27/2019 Stable fixation alignment of humerus fracture.   PMFS History: Patient Active Problem List   Diagnosis Date Noted  . Closed displaced oblique fracture of shaft of right humerus 03/01/2019  . FINGER PAIN 08/28/2007  . FRACTURE, FINGER, PROXIMAL 08/28/2007   Past Medical History:  Diagnosis Date  . Anxiety    03/05/2019- in the past, not current  . History of kidney stones    passed    History reviewed. No pertinent family history.  Past Surgical History:  Procedure Laterality Date  . NO PAST SURGERIES    . ORIF HUMERUS FRACTURE Right 03/07/2019   Procedure: OPEN REDUCTION INTERNAL FIXATION (ORIF) RIGHT HUMERUS;  Surgeon: Leandrew Koyanagi, MD;  Location: [facility];  Service: Orthopedics;  Laterality: Right;   Social History   Occupational History  . Not on file  Tobacco Use  . Smoking status: Former Smoker    Quit date: 02/20/2019    Years since quitting: 0.0  . Smokeless tobacco: Never Used  Substance and Sexual Activity  . Alcohol use: Yes    Comment: ocassional  . Drug use: Yes    Comment: vicodin  . Sexual activity: Not on file

## 2019-04-17 ENCOUNTER — Ambulatory Visit: Payer: Self-pay | Attending: Orthopaedic Surgery | Admitting: Occupational Therapy

## 2019-04-17 ENCOUNTER — Other Ambulatory Visit: Payer: Self-pay

## 2019-04-17 DIAGNOSIS — R29818 Other symptoms and signs involving the nervous system: Secondary | ICD-10-CM | POA: Insufficient documentation

## 2019-04-17 DIAGNOSIS — M25621 Stiffness of right elbow, not elsewhere classified: Secondary | ICD-10-CM | POA: Insufficient documentation

## 2019-04-17 DIAGNOSIS — M25611 Stiffness of right shoulder, not elsewhere classified: Secondary | ICD-10-CM | POA: Insufficient documentation

## 2019-04-17 DIAGNOSIS — M6281 Muscle weakness (generalized): Secondary | ICD-10-CM | POA: Insufficient documentation

## 2019-04-17 DIAGNOSIS — R278 Other lack of coordination: Secondary | ICD-10-CM | POA: Insufficient documentation

## 2019-04-17 NOTE — Therapy (Signed)
[facility] [address] [place], [place], [address] Phone: [phone]   Fax:  [phone]  Occupational Therapy Evaluation  Patient Details  Name: Walter Howe MRN: [id number] Date of Birth: [birth year] Referring Provider (OT): Dr. Gershon Mussel   Encounter Date: 04/17/2019  OT End of Session - 04/17/19 1225    Visit Number  1    Number of Visits  17    Date for OT Re-Evaluation  06/17/19    Authorization Type  self pay    OT Start Time  0800    OT Stop Time  0845    OT Time Calculation (min)  45 min    Activity Tolerance  Patient tolerated treatment well       Past Medical History:  Diagnosis Date  . Anxiety    03/05/2019- in the past, not current  . History of kidney stones    passed    Past Surgical History:  Procedure Laterality Date  . NO PAST SURGERIES    . ORIF HUMERUS FRACTURE Right 03/07/2019   Procedure: OPEN REDUCTION INTERNAL FIXATION (ORIF) RIGHT HUMERUS;  Surgeon: Tarry Kos, MD;  Location: [facility];  Service: Orthopedics;  Laterality: Right;    There were no vitals filed for this visit.  Subjective Assessment - 04/17/19 0804    Subjective   No pain today, just stiffness    Pertinent History  ORIF s/p humeral shaft fx 03/07/19 w/ radial n. palsy    Limitations  no strengthening or weight bearing RUE. OK for A/ROM and P/ROM per MD    Currently in Pain?  No/denies        [facility] OT Assessment - 04/17/19 0001      Assessment   Medical Diagnosis  s/p ORIF Rt humeral shaft fx 03/07/19 w/ radial n. palsy    Referring Provider (OT)  Dr. 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[employer], singing      ADL   Eating/Feeding  Modified independent   50% w/ Rt dominant hand, 50% w/ Lt hand   Grooming  Modified independent    Grooming Patient Percentage  --   w/ Lt hand only   Upper Body Bathing  Modified independent    Lower Body Bathing  Modified independent    Upper Body Dressing  Increased time   cannot do certain buttons   Lower Body Dressing  Modified independent    Toilet Transfer  Independent    Toileting -  Hygiene  Modified Independent   w/ Lt hand   Tub/Shower Transfer  Independent  IADL   Shopping  --   Dad doing right now   Meal Prep  Plans, prepares and serves adequate meals independently   w/ Lt hand to lift pots/pans   Community Mobility  Relies on family or friends for transportation      Mobility   Mobility Status  Independent      Written Expression   Dominant Hand  Right    Handwriting  50% legible   print only, name only w/ support to wrist     Vision - History   Baseline Vision  No visual deficits      Observation/Other Assessments   Observations  Pt w/ wrist drop. Pt was also pushing up from chair w/ RUE and therapist instructed not to      Sensation   Additional Comments  Pt intact for light touch and localization, however feels like "it's waking up"       Coordination   9 Hole Peg Test  Right;Left    Right 9 Hole Peg Test  35.41 sec    Left 9 Hole Peg Test  18.06 sec      Edema   Edema  localized at dorsal wrist - moderate, none in hand/fingers      ROM / Strength   AROM / PROM / Strength  AROM      AROM   Overall AROM Comments  LUE AROM WNL's. RUE shoulder A/ROM WFL's, elbow flex WNL's,  elbow ext = -25*. Pt has no active wrist extension at wrist, no MP extension digits 3-5, and no radial abduction or ext at thumb. Pt has minimal index MP extension      Hand Function   Right Hand Grip (lbs)  45 lbs    Left Hand Grip (lbs)  109 lbs               OT Treatments/Exercises (OP) - 04/17/19 0001      Splinting   Splinting  Pt issued D-ring wrist cock up brace and instructed in wear and care of brace. Therapist to begin fabricating custom dorsal radial n. palsy splint next session              OT Short Term Goals - 04/17/19 1232      OT SHORT TERM GOAL #1   Title  Independent with splint wear and care - 05/17/19    Time  4    Period  Weeks    Status  New      OT SHORT TERM GOAL #2   Title  Independent with initial HEP    Time  4    Period  Weeks    Status  New      OT SHORT TERM GOAL #3   Title  Pt to feed self 75% of the time with Rt dominant hand    Baseline  50%    Time  4    Period  Weeks    Status  New      OT SHORT TERM GOAL #4   Title  Pt to groom self 25% of time w/ Rt dominant hand    Baseline  currently all w/ Lt hand    Time  4    Period  Weeks    Status  New      OT SHORT TERM GOAL #5   Title  Pt to write sentence at 75% or greater legibility w/ splint and A/E prn    Baseline  50% name only  Time  4    Period  Weeks    Status  New      Additional Short Term Goals   Additional Short Term Goals  Yes      OT SHORT TERM GOAL #6   Title  Pt to perform 50% wrist ext and MP ext in gravity eliminated plane    Baseline  none    Time  4    Period  Weeks    Status  New        OT Long Term Goals - 04/17/19 1238      OT LONG TERM GOAL #1   Title  Independent with updated HEP - 06/17/19    Time  8    Period  Weeks    Status  New      OT LONG TERM GOAL #2   Title  Pt will feed self 100% with Rt dominant hand    Baseline  50%    Time  8    Period  Weeks    Status  New      OT LONG TERM GOAL #3   Title  Pt will perform  grooming tasks 75% w/ Rt dominant hand    Baseline  all w/ Lt hand    Time  8    Period  Weeks    Status  New      OT LONG TERM GOAL #4   Title  Pt will demo 50% or greater wrist extension and MP extension against gravity    Baseline  none    Time  8    Period  Weeks    Status  New      OT LONG TERM GOAL #5   Title  Pt will demo 60 lbs grip strength Rt hand    Baseline  45 lbs    Time  8    Period  Weeks    Status  New            Plan - 04/17/19 1226    Clinical Impression Statement  Pt is a 33 y.o. 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 Pt presents today w/ weakness entire RUE at shoulder and ebow from fracture, and wrist drop and no MP extension from radial n. palsy. Pt would benefit from O.T. to address these deficits, for splinting needs, and increase function in Rt dominant UE.    Occupational performance deficits (Please refer to evaluation for details):  ADL's;IADL's;Work;Social Participation    Body Structure / Function / Physical Skills  ADL;IADL;ROM;Scar mobility;Sensation;Strength;Coordination;Pain;UE functional use;Decreased knowledge of use of DME    Rehab Potential  Good    Clinical Decision Making  Limited treatment options, no task modification necessary    Comorbidities Affecting Occupational Performance:  May have comorbidities impacting occupational performance    Modification or Assistance to Complete Evaluation   No modification of tasks or assist necessary to complete eval    OT Frequency  2x / week    OT Duration  8 weeks   PLUS EVAL (May reduce to 1x/wk when appropriate)   OT Treatment/Interventions  Self-care/ADL training;Therapeutic exercise;Ultrasound;Manual Therapy;Splinting;Therapeutic activities;DME and/or AE instruction;Compression bandaging;Paraffin;Fluidtherapy;Moist Heat;Passive range of  motion;Patient/family education;Electrical Stimulation    Plan  begin radial n. palsy splint    Consulted and Agree with Plan of Care  Patient  Patient will benefit from skilled therapeutic intervention in order to improve the following deficits and impairments:   Body Structure / Function / Physical Skills: ADL, IADL, ROM, Scar mobility, Sensation, Strength, Coordination, Pain, UE functional use, Decreased knowledge of use of DME       Visit Diagnosis: Muscle weakness (generalized)  Other symptoms and signs involving the nervous system  Other lack of coordination  Stiffness of right elbow, not elsewhere classified  Stiffness of right shoulder, not elsewhere classified    Problem List Patient Active Problem List   Diagnosis Date Noted  . Closed displaced oblique fracture of shaft of right humerus 03/01/2019  . FINGER PAIN 08/28/2007  . FRACTURE, FINGER, PROXIMAL 08/28/2007    Carey Bullocks, OTR/L 04/17/2019, 12:44 PM  [facility] [address] [place], [place], [address] Phone: [phone]   Fax:  [phone]  Name: Walter Howe MRN: [id number] Date of Birth: July 22, 1985

## 2019-04-27 ENCOUNTER — Ambulatory Visit (HOSPITAL_COMMUNITY)
Admission: RE | Admit: 2019-04-27 | Discharge: 2019-04-27 | Disposition: A | Discharge status: Home / Self Care | Payer: Self-pay | Attending: Psychiatry | Admitting: Psychiatry
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
## 2019-04-29 DIAGNOSIS — F4310 Post-traumatic stress disorder, unspecified: Secondary | ICD-10-CM | POA: Insufficient documentation

## 2019-05-26 ENCOUNTER — Inpatient Hospital Stay (HOSPITAL_COMMUNITY)
Admission: AD | Admit: 2019-05-26 | Discharge: 2019-05-30 | DRG: 885 | Disposition: A | Discharge status: Home / Self Care | Payer: Federal, State, Local not specified - Other | Attending: Psychiatry | Admitting: Psychiatry

## 2019-05-26 ENCOUNTER — Other Ambulatory Visit: Payer: Self-pay

## 2019-05-26 ENCOUNTER — Encounter (HOSPITAL_COMMUNITY): Payer: Self-pay | Admitting: Adult Health

## 2019-05-26 DIAGNOSIS — F250 Schizoaffective disorder, bipolar type: Principal | ICD-10-CM | POA: Diagnosis present

## 2019-05-26 DIAGNOSIS — Z20822 Contact with and (suspected) exposure to covid-19: Secondary | ICD-10-CM | POA: Diagnosis present

## 2019-05-26 DIAGNOSIS — Z87891 Personal history of nicotine dependence: Secondary | ICD-10-CM

## 2019-05-26 DIAGNOSIS — F329 Major depressive disorder, single episode, unspecified: Secondary | ICD-10-CM | POA: Diagnosis present

## 2019-05-26 DIAGNOSIS — Z79899 Other long term (current) drug therapy: Secondary | ICD-10-CM | POA: Diagnosis not present

## 2019-05-26 DIAGNOSIS — F331 Major depressive disorder, recurrent, moderate: Secondary | ICD-10-CM | POA: Diagnosis not present

## 2019-05-26 DIAGNOSIS — R45851 Suicidal ideations: Secondary | ICD-10-CM | POA: Diagnosis present

## 2019-05-26 DIAGNOSIS — F333 Major depressive disorder, recurrent, severe with psychotic symptoms: Secondary | ICD-10-CM | POA: Diagnosis not present

## 2019-05-26 DIAGNOSIS — F419 Anxiety disorder, unspecified: Secondary | ICD-10-CM | POA: Diagnosis not present

## 2019-05-26 LAB — RESPIRATORY PANEL BY RT PCR (FLU A&B, COVID)
Influenza A by PCR: NEGATIVE
Influenza B by PCR: NEGATIVE
SARS Coronavirus 2 by RT PCR: NEGATIVE

## 2019-05-26 MED ORDER — ACETAMINOPHEN 325 MG PO TABS: 650.0000 mg | ORAL_TABLET | Freq: Four times a day (QID) | ORAL | Status: DC | PRN

## 2019-05-26 MED ORDER — HYDROXYZINE HCL 25 MG PO TABS
25.0000 mg | ORAL_TABLET | Freq: Three times a day (TID) | ORAL | Status: DC | PRN
  Administered 2019-05-26 – 2019-05-28 (×4): 25 mg via ORAL
  Filled 2019-05-26 (×3): qty 1
  Filled 2019-05-26: qty 10
  Filled 2019-05-26: qty 1

## 2019-05-26 MED ORDER — TRAZODONE HCL 50 MG PO TABS
50.0000 mg | ORAL_TABLET | Freq: Every evening | ORAL | Status: DC | PRN
  Administered 2019-05-26 – 2019-05-29 (×4): 50 mg via ORAL
  Filled 2019-05-26 (×4): qty 1
  Filled 2019-05-26: qty 7
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
## 2019-05-27 DIAGNOSIS — R45851 Suicidal ideations: Secondary | ICD-10-CM

## 2019-05-27 LAB — CBC
HCT: 43.7 % (ref 39.0–52.0)
Hemoglobin: 14.4 g/dL (ref 13.0–17.0)
MCH: 29.3 pg (ref 26.0–34.0)
MCHC: 33 g/dL (ref 30.0–36.0)
MCV: 88.8 fL (ref 80.0–100.0)
Platelets: 283 10*3/uL (ref 150–400)
RBC: 4.92 MIL/uL (ref 4.22–5.81)
RDW: 11.6 % (ref 11.5–15.5)
WBC: 6.8 10*3/uL (ref 4.0–10.5)
nRBC: 0 % (ref 0.0–0.2)

## 2019-05-27 LAB — RAPID URINE DRUG SCREEN, HOSP PERFORMED
Amphetamines: NOT DETECTED
Barbiturates: NOT DETECTED
Benzodiazepines: NOT DETECTED
Cocaine: NOT DETECTED
Opiates: NOT DETECTED
Tetrahydrocannabinol: NOT DETECTED

## 2019-05-27 LAB — URINALYSIS, ROUTINE W REFLEX MICROSCOPIC
Bilirubin Urine: NEGATIVE
Glucose, UA: NEGATIVE mg/dL
Hgb urine dipstick: NEGATIVE
Ketones, ur: NEGATIVE mg/dL
Nitrite: NEGATIVE
Protein, ur: NEGATIVE mg/dL
Specific Gravity, Urine: 1.015 (ref 1.005–1.030)
pH: 7 (ref 5.0–8.0)

## 2019-05-27 LAB — LDL CHOLESTEROL, DIRECT: Direct LDL: 125.8 mg/dL — ABNORMAL HIGH (ref 0–99)

## 2019-05-27 LAB — LIPID PANEL
Cholesterol: 228 mg/dL — ABNORMAL HIGH (ref 0–200)
HDL: 56 mg/dL (ref >40)
LDL Cholesterol: UNDETERMINED mg/dL (ref 0–99)
Total CHOL/HDL Ratio: 4.1 RATIO
Triglycerides: 411 mg/dL — ABNORMAL HIGH (ref <150)
VLDL: UNDETERMINED mg/dL (ref 0–40)

## 2019-05-27 LAB — HEMOGLOBIN A1C
Hgb A1c MFr Bld: 4.9 % (ref 4.8–5.6)
Mean Plasma Glucose: 93.93 mg/dL

## 2019-05-27 LAB — ETHANOL: Alcohol, Ethyl (B): <10 mg/dL (ref <10)

## 2019-05-27 LAB — TSH: TSH: 2.742 u[IU]/mL (ref 0.350–4.500)

## 2019-05-27 MED ORDER — ENSURE ENLIVE PO LIQD
237.0000 mL | Freq: Two times a day (BID) | ORAL | Status: DC
  Administered 2019-05-27 – 2019-05-29 (×3): 237 mL via ORAL

## 2019-05-27 MED ORDER — NICOTINE POLACRILEX 2 MG MT GUM
2.0000 mg | CHEWING_GUM | OROMUCOSAL | Status: DC | PRN
  Administered 2019-05-27 – 2019-05-29 (×8): 2 mg via ORAL
  Filled 2019-05-27 (×5): qty 1

## 2019-05-27 MED ORDER — OLANZAPINE 10 MG PO TABS
10.0000 mg | ORAL_TABLET | Freq: Every day | ORAL | Status: DC
  Administered 2019-05-27 – 2019-05-29 (×3): 10 mg via ORAL
  Filled 2019-05-27 (×4): qty 1

## 2019-05-27 MED ORDER — PRENATAL MULTIVITAMIN CH
1.0000 | ORAL_TABLET | Freq: Every day | ORAL | Status: DC
  Administered 2019-05-27 – 2019-05-30 (×4): 1 via ORAL
  Filled 2019-05-27 (×4): qty 1

## 2019-05-27 MED ORDER — OMEGA-3-ACID ETHYL ESTERS 1 G PO CAPS
1.0000 g | ORAL_CAPSULE | Freq: Two times a day (BID) | ORAL | Status: DC
  Administered 2019-05-27 – 2019-05-30 (×7): 1 g via ORAL
  Filled 2019-05-27 (×10): qty 1

## 2019-05-27 MED ORDER — FLUOXETINE HCL 20 MG PO CAPS
20.0000 mg | ORAL_CAPSULE | Freq: Every day | ORAL | Status: DC
  Administered 2019-05-27 – 2019-05-30 (×4): 20 mg via ORAL
  Filled 2019-05-27 (×5): qty 1
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
## 2019-05-27 NOTE — [facility] Suicide Risk Assessment (Signed)
[facility] Admission Suicide Risk Assessment   Nursing information obtained from:  Patient Demographic factors:  Male Current Mental Status:  Suicidal ideation indicated by patient Loss Factors:  Financial problems / change in socioeconomic status Historical Factors:  Impulsivity Risk Reduction Factors:  Positive social support  Total Time spent with patient: 45 minutes Principal Problem: Suicidal ideations Diagnosis:  Principal Problem:   Suicidal ideations Active Problems:   MDD (major depressive disorder)  Subjective Data: h/o TBI- subsequent issues exacerbarted   Continued Clinical Symptoms:  Alcohol Use Disorder Identification Test Final Score (AUDIT): 2 The "Alcohol Use Disorders Identification Test", Guidelines for Use in Primary Care, Second Edition.  World Science writer [facility]). Score between 0-7:  no or low risk or alcohol related problems. Score between 8-15:  moderate risk of alcohol related problems. Score between 16-19:  high risk of alcohol related problems. Score 20 or above:  warrants further diagnostic evaluation for alcohol dependence and treatment.   CLINICAL FACTORS:   Previous Psychiatric Diagnoses and Treatments Medical Diagnoses and Treatments/Surgeries   Musculoskeletal: Strength & Muscle Tone: within normal limits Gait & Station: normal Patient leans: N/A  Psychiatric Specialty Exam: Physical Exam  Nursing note and vitals reviewed. Constitutional: He appears well-developed and well-nourished.  Cardiovascular: Normal rate and regular rhythm.    Review of Systems  Constitutional: Negative.   Eyes: Negative.   Cardiovascular: Negative.   Genitourinary: Negative.   Musculoskeletal: Negative.   Neurological: Negative.     Blood pressure 98/64, pulse (!) 114, temperature 98.1 F (36.7 C), resp. rate 18, height 5' 5.75" (1.67 m), weight 51.3 kg, SpO2 97 %.Body mass index is 18.38 kg/m.  General Appearance: Casual  Eye Contact:  Fair  Speech:   Clear and Coherent  Volume:  Normal  Mood:  Anxious and Dysphoric  Affect:  Appropriate and Congruent  Thought Process:  Coherent and Goal Directed  Orientation:  Full (Time, Place, and Person)  Thought Content:  Rumination  Suicidal Thoughts:  Yes.  without intent/plan  Homicidal Thoughts:  No  Memory:  Immediate;   Fair Recent;   Fair Remote;   Fair  Judgement:  Fair  Insight:  Fair  Psychomotor Activity:  Normal  Concentration:  Concentration: Fair and Attention Span: Fair  Recall:  [employer] of Knowledge:  Fair  Language:  Good  Akathisia:  Negative  Handed:  Right  AIMS (if indicated):     Assets:  Physical Health Resilience  ADL's:  Intact  Cognition:  WNL  Sleep:  Number of Hours: 5.5      COGNITIVE FEATURES THAT CONTRIBUTE TO RISK:  Polarized thinking    SUICIDE RISK:   Severe:  Frequent, intense, and enduring suicidal ideation, specific plan, no subjective intent, but some objective markers of intent (i.e., choice of lethal method), the method is accessible, some limited preparatory behavior, evidence of impaired self-control, severe dysphoria/symptomatology, multiple risk factors present, and few if any protective factors, particularly a lack of social support.  PLAN OF CARE: admit for stabilization   I certify that inpatient services furnished can reasonably be expected to improve the patient's condition.   Malvin Johns, MD 05/27/2019, 9:58 AM
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
## 2019-05-27 NOTE — Progress Notes (Signed)
Adult Psychoeducational Group Note  Date:  05/27/2019 Time:  9:29 PM  Group Topic/Focus:  Wrap-Up Group:   The focus of this group is to help patients review their daily goal of treatment and discuss progress on daily workbooks.  Participation Level:  Active  Participation Quality:  Appropriate  Affect:  Appropriate  Cognitive:  Appropriate  Insight: Appropriate  Engagement in Group:  Engaged  Modes of Intervention:  Discussion  Additional Comments:  Patient attended group and participated.   Jakaree Pickard W Manav Pierotti 05/27/2019, 9:29 PM

## 2019-05-27 NOTE — Progress Notes (Signed)
   05/27/19 2226  Psych Admission Type (Psych Patients Only)  Admission Status Voluntary  Psychosocial Assessment  Patient Complaints Sleep disturbance;Depression  Eye Contact Fair  Facial Expression Anxious;Sad  Affect Depressed;Anxious  Speech Logical/coherent  Interaction Assertive  Motor Activity Other (Comment) (WNL)  Appearance/Hygiene Unremarkable  Behavior Characteristics Cooperative;Anxious  Mood Anxious;Depressed  Thought Process  Coherency WDL  Content WDL  Delusions WDL  Perception WDL  Hallucination None reported or observed  Judgment Impaired  Confusion None  Danger to Self  Current suicidal ideation? Denies  Danger to Others  Danger to Others Reported or observed   Pt is pleasant. Mood is anxious and depressed. Says today was not bad but hoping that tomorrow will be better. Pt says he kept to himself most of the day since it was his first full day here. Pt denies SI, HI, AVH and pain. Pt given evening medication along with trazodone 50mg  for sleep. Contracts for safety.

## 2019-05-27 NOTE — Progress Notes (Signed)
EKG completed and put in patient's chart.   UA placed in lab refrigerator for this afternoon's pick up.

## 2019-05-27 NOTE — Progress Notes (Signed)
Recreation Therapy Notes  Date:  1.4.21 Time: 0930 Location: [address] Dayroom  Group Topic: Stress Management  Goal Area(s) Addresses:  Patient will identify positive stress management techniques. Patient will identify benefits of using stress management post d/c.  Intervention: Stress Management  Activity :  Meditation.  LRT played a meditation that focused on being resilient in the face of adversity.  Patients were to listen and follow along as meditation was played.  Education:  Stress Management, Discharge Planning.   Education Outcome: Acknowledges Education  Clinical Observations/Feedback: Pt did not attend group session.    Caroll Rancher, LRT/CTRS         Caroll Rancher A 05/27/2019 10:53 AM

## 2019-05-27 NOTE — [facility] Counselor (Signed)
Patient is current with [facility] for outpatient management for therapy and medication management. CSW discussed referring patient for CST/TCT services in addition to therapy and medication management. Patient was agreeable to the extra supports.   CSW left a detailed voicemail for [employer], Secondary school teacher at [employer] [phone]).  Enid Cutter, MSW, LCSW-A Clinical Social Worker [facility]  [phone]

## 2019-05-27 NOTE — Tx Team (Signed)
Interdisciplinary Treatment and Diagnostic Plan Update  05/27/2019 Time of Session:  Walter Howe MRN: [id number]  Principal Diagnosis: Suicidal ideations  Secondary Diagnoses: Principal Problem:   Suicidal ideations Active Problems:   MDD (major depressive disorder)   Current Medications:  Current Facility-Administered Medications  Medication Dose Route Frequency Provider Last Rate Last Admin  . acetaminophen (TYLENOL) tablet 650 mg  650 mg Oral Q6H PRN Merlyn Lot E, NP      . hydrOXYzine (ATARAX/VISTARIL) tablet 25 mg  25 mg Oral TID PRN Mallie Darting, NP   25 mg at 05/27/19 0734  . nicotine polacrilex (NICORETTE) gum 2 mg  2 mg Oral PRN Akintayo, Mojeed, MD      . traZODone (DESYREL) tablet 50 mg  50 mg Oral QHS PRN Mallie Darting, NP   50 mg at 05/26/19 2315   PTA Medications: Medications Prior to Admission  Medication Sig Dispense Refill Last Dose  . HYDROcodone-acetaminophen (NORCO) 5-325 MG tablet Take 1-2 tablets by mouth 2 (two) times daily as needed for moderate pain. (Patient not taking: Reported on 04/17/2019) 20 tablet 0   . naproxen (NAPROSYN) 500 MG tablet Take 1 tablet (500 mg total) by mouth 2 (two) times daily with a meal. (Patient not taking: Reported on 04/17/2019) 30 tablet 3   . ondansetron (ZOFRAN) 4 MG tablet Take 1-2 tablets (4-8 mg total) by mouth every 8 (eight) hours as needed for nausea or vomiting. (Patient not taking: Reported on 04/17/2019) 40 tablet 0   . oxyCODONE-acetaminophen (PERCOCET) 5-325 MG tablet Take 1 tablet by mouth every 6 (six) hours as needed for moderate pain. (Patient not taking: Reported on 04/17/2019) 15 tablet 0   . oxyCODONE-acetaminophen (PERCOCET) 5-325 MG tablet Take 1-2 tablets by mouth every 8 (eight) hours as needed for severe pain. (Patient not taking: Reported on 04/17/2019) 30 tablet 0     Patient Stressors:    Patient Strengths:    Treatment Modalities: Medication Management, Group therapy, Case management,  1  to 1 session with clinician, Psychoeducation, Recreational therapy.   Physician Treatment Plan for Primary Diagnosis: Suicidal ideations Long Term Goal(s):     Short Term Goals:    Medication Management: Evaluate patient's response, side effects, and tolerance of medication regimen.  Therapeutic Interventions: 1 to 1 sessions, Unit Group sessions and Medication administration.  Evaluation of Outcomes: Not Met  Physician Treatment Plan for Secondary Diagnosis: Principal Problem:   Suicidal ideations Active Problems:   MDD (major depressive disorder)  Long Term Goal(s):     Short Term Goals:       Medication Management: Evaluate patient's response, side effects, and tolerance of medication regimen.  Therapeutic Interventions: 1 to 1 sessions, Unit Group sessions and Medication administration.  Evaluation of Outcomes: Not Met   RN Treatment Plan for Primary Diagnosis: Suicidal ideations Long Term Goal(s): Knowledge of disease and therapeutic regimen to maintain health will improve  Short Term Goals: Ability to participate in decision making will improve, Ability to verbalize feelings will improve, Ability to disclose and discuss suicidal ideas, Ability to identify and develop effective coping behaviors will improve and Compliance with prescribed medications will improve  Medication Management: RN will administer medications as ordered by provider, will assess and evaluate patient's response and provide education to patient for prescribed medication. RN will report any adverse and/or side effects to prescribing provider.  Therapeutic Interventions: 1 on 1 counseling sessions, Psychoeducation, Medication administration, Evaluate responses to treatment, Monitor vital signs and CBGs as ordered,  Perform/monitor CIWA, COWS, AIMS and Fall Risk screenings as ordered, Perform wound care treatments as ordered.  Evaluation of Outcomes: Not Met   LCSW Treatment Plan for Primary Diagnosis:  Suicidal ideations Long Term Goal(s): Safe transition to appropriate next level of care at discharge, Engage patient in therapeutic group addressing interpersonal concerns.  Short Term Goals: Engage patient in aftercare planning with referrals and resources  Therapeutic Interventions: Assess for all discharge needs, 1 to 1 time with Social worker, Explore available resources and support systems, Assess for adequacy in community support network, Educate family and significant other(s) on suicide prevention, Complete Psychosocial Assessment, Interpersonal group therapy.  Evaluation of Outcomes: Not Met   Progress in Treatment: Attending groups: No. Participating in groups: No. Taking medication as prescribed: Yes. Toleration medication: Yes. Family/Significant other contact made: No, will contact:  if patient consents to collateral contacts Patient understands diagnosis: Yes. Discussing patient identified problems/goals with staff: Yes. Medical problems stabilized or resolved: Yes. Denies suicidal/homicidal ideation: Yes. Issues/concerns per patient self-inventory: No. Other:   New problem(s) identified: None   New Short Term/Long Term Goal(s): medication stabilization, elimination of SI thoughts, development of comprehensive mental wellness plan.    Patient Goals:    Discharge Plan or Barriers: Patient recently admitted. CSW will continue to follow and assess for appropriate referrals and possible discharge planning.    Reason for Continuation of Hospitalization: Depression Hallucinations Medication stabilization Suicidal ideation  Estimated Length of Stay: 3-5 days   Attendees: Patient: 05/27/2019 8:58 AM  Physician: Dr. Johnn Hai, MD 05/27/2019 8:58 AM  Nursing: Rise Paganini.Raliegh Ip, RN 05/27/2019 8:58 AM  RN Care Manager: 05/27/2019 8:58 AM  Social Worker: [name], LCSW 05/27/2019 8:58 AM  Recreational Therapist:  05/27/2019 8:58 AM  Other:  05/27/2019 8:58 AM  Other:  05/27/2019 8:58  AM  Other: 05/27/2019 8:58 AM    Scribe for Treatment Team: Marylee Floras, [place] 05/27/2019 8:58 AM
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
## 2019-05-27 NOTE — Progress Notes (Deleted)
   05/27/19 2226  Psych Admission Type (Psych Patients Only)  Admission Status Voluntary  Psychosocial Assessment  Patient Complaints Sleep disturbance;Depression  Eye Contact Fair  Facial Expression Other (Comment) (appropriate)  Affect Depressed  Speech Logical/coherent  Interaction Assertive  Motor Activity Other (Comment) (WNL)  Appearance/Hygiene Unremarkable  Behavior Characteristics Cooperative  Mood Euthymic  Thought Process  Coherency WDL  Content WDL  Delusions WDL  Perception WDL  Hallucination None reported or observed  Judgment Impaired  Confusion None  Danger to Self  Current suicidal ideation? Denies  Danger to Others  Danger to Others Reported or observed   Pt pleasant and cooperative. Pt denies SI, HI, AVH and pain. Pt does endorse vivid dreams last night when sleeping that disturbed his rest. Pt given trazodone 50mg  for sleep tonight. Pt says that he wakes himself up when he has these dreams and then goes back to sleep. Pt contracts for safety.

## 2019-05-27 NOTE — [facility] Group Notes (Signed)
Type of Therapy/Topic: Identifying Irrational Beliefs/Thoughts  Participation Level: Did Not Attend  Description of Group: The purpose of this group is to assist patients in learning to identify irrational beliefs and thoughts that contribute to their negative emotions and experience positive emotions. Patients will be guided to discuss ways in which they have been effected by irrational thoughts and beliefs and how to transform those irrational beliefs into rational ones. Newly identified rational beliefs will be juxtaposed with experiences of positive emotions or situations, and patients will be challenged to use rational beliefs or thoughts to combat negative ones. Special emphasis will be placed on coping with irrational beliefs in conflict situations, and patients will process healthy conflict resolution skills.  Therapeutic Goals: 1. Patient will identify two irrational thoughts or beliefs  to reflect on in order to balance out those thoughts 2. Patient will label two or more irrational thoughts/beliefs that they find the most difficult to cope with 3. Patient will demonstrate positive conflict resolution skills through discussion and/or role plays that will assist in transforming irrational thoughts or beliefs into positive ones.  Summary of Patient Progress:  Due to the COVID-19 Pandemic, this group was supplemented with a worksheet and direct patient feedback.   Therapeutic Modalities: Cognitive Behavioral Therapy Feelings Identification Dialectical Behavioral Therapy 

## 2019-05-27 NOTE — Progress Notes (Signed)
NUTRITION ASSESSMENT  Pt identified as at risk on the Malnutrition Screen Tool  INTERVENTION: 1. Supplements: Ensure Enlive po BID, each supplement provides 350 kcal and 20 grams of protein  NUTRITION DIAGNOSIS: Unintentional weight loss related to sub-optimal intake as evidenced by pt report.   Goal: Pt to meet >/= 90% of their estimated nutrition needs.  Monitor:  PO intake  Assessment:  Pt admitted for depression and SI. Pt reports poor appetite and reports weight loss of ~35 lbs since February 2020. Pt is underweight based on BMI. Will order Ensure supplements.  Height: Ht Readings from Last 1 Encounters:  05/26/19 5' 5.75" (1.67 m)    Weight: Wt Readings from Last 1 Encounters:  05/26/19 51.3 kg    Weight Hx: Wt Readings from Last 10 Encounters:  05/26/19 51.3 kg  03/27/19 52.2 kg  03/13/19 52.2 kg  03/07/19 52.2 kg  02/28/19 52.2 kg    BMI:  Body mass index is 18.38 kg/m. Pt meets criteria for underweight based on current BMI.  Estimated Nutritional Needs: Kcal: 25-30 kcal/kg Protein: > 1 gram protein/kg Fluid: 1 ml/kcal  Diet Order:  Diet Order            Diet regular Room service appropriate? Yes; Fluid consistency: Thin  Diet effective now             Pt is also offered choice of unit snacks mid-morning and mid-afternoon.  Pt is eating as desired.   Lab results and medications reviewed.   Tilda Franco, MS, RD, LDN Inpatient Clinical Dietitian Pager: [phone] After Hours Pager: [phone]
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
## 2019-05-27 NOTE — Plan of Care (Signed)
Nurse discussed anxiety, depression and coping skills with patient.  

## 2019-05-28 NOTE — Progress Notes (Signed)
    05/28/19 1000  Psych Admission Type (Psych Patients Only)  Admission Status Voluntary  Psychosocial Assessment  Patient Complaints None  Eye Contact Fair  Facial Expression Anxious;Sad  Affect Depressed;Anxious  Speech Logical/coherent  Interaction Assertive  Motor Activity Other (Comment) (WNL)  Appearance/Hygiene Unremarkable  Behavior Characteristics Cooperative  Mood Anxious  Thought Process  Coherency WDL  Content WDL  Delusions WDL  Perception WDL  Hallucination None reported or observed  Judgment Impaired  Confusion None  Danger to Self  Current suicidal ideation? Denies  Danger to Others  Danger to Others Reported or observed

## 2019-05-28 NOTE — Progress Notes (Signed)
Recreation Therapy Notes  Animal-Assisted Activity (AAA) Program Checklist/Progress Notes Patient Eligibility Criteria Checklist & Daily Group note for Rec Tx Intervention  Date: 1.5.21 Time: 1430 Location: [address]   AAA/T Program Assumption of Risk Form signed by Engineer, production or Parent Legal Guardian  YES   Patient is free of allergies or sever asthma  YES   Patient reports no fear of animals  YES   Patient reports no history of cruelty to animals  YES   Patient understands his/her participation is voluntary  YES   Patient washes hands before animal contact  YES   Patient washes hands after animal contact  YES   Education: Charity fundraiser, Appropriate Animal Interaction   Education Outcome: Acknowledges understanding/In group clarification offered/Needs additional education.   Clinical Observations/Feedback:  Pt did not attend activity.    Walter Howe, LRT/CTRS     Walter Howe A 05/28/2019 3:40 PM
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
## 2019-05-28 NOTE — Progress Notes (Signed)
Adult Psychoeducational Group Note  Date:  05/28/2019 Time:  8:58 PM  Group Topic/Focus:  Wrap-Up Group:   The focus of this group is to help patients review their daily goal of treatment and discuss progress on daily workbooks.  Participation Level:  Active  Participation Quality:  Appropriate  Affect:  Appropriate  Cognitive:  Appropriate  Insight: Appropriate  Engagement in Group:  Engaged  Modes of Intervention:  Discussion  Additional Comments:  Patient attended group and participated.   Jovonna Nickell W Anselma Herbel 05/28/2019, 8:58 PM

## 2019-05-29 DIAGNOSIS — F333 Major depressive disorder, recurrent, severe with psychotic symptoms: Secondary | ICD-10-CM

## 2019-05-29 NOTE — [facility] Group Notes (Signed)
LCSW Group Therapy Note 05/29/2019 2:30 PM  Type of Therapy and Topic: Group Therapy: Overcoming Obstacles  Participation Level: Active  Description of Group:  In this group patients will be encouraged to explore what they see as obstacles to their own wellness and recovery. They will be guided to discuss their thoughts, feelings, and behaviors related to these obstacles. The group will process together ways to cope with barriers, with attention given to specific choices patients can make. Each patient will be challenged to identify changes they are motivated to make in order to overcome their obstacles. This group will be process-oriented, with patients participating in exploration of their own experiences as well as giving and receiving support and challenge from other group members.  Therapeutic Goals: 1. Patient will identify personal and current obstacles as they relate to admission. 2. Patient will identify barriers that currently interfere with their wellness or overcoming obstacles.  3. Patient will identify feelings, thought process and behaviors related to these barriers. 4. Patient will identify two changes they are willing to make to overcome these obstacles:   Summary of Patient Progress  Andyn participated and was engaged throughout the group session. Jerol reports he does not have any obstacles at this time.    Therapeutic Modalities:  Cognitive Behavioral Therapy Solution Focused Therapy Motivational Interviewing Relapse Prevention Therapy   Alcario Drought Clinical Social Worker

## 2019-05-29 NOTE — Progress Notes (Signed)
   05/28/19 2130  Psych Admission Type (Psych Patients Only)  Admission Status Voluntary  Psychosocial Assessment  Patient Complaints None  Eye Contact Fair  Facial Expression Sad;Anxious  Affect Depressed;Anxious  Speech Logical/coherent  Interaction Assertive  Motor Activity Other (Comment) (WNL)  Appearance/Hygiene Unremarkable  Behavior Characteristics Cooperative  Mood Pleasant;Anxious  Aggressive Behavior  Effect No apparent injury  Thought Process  Coherency WDL  Content WDL  Delusions WDL  Perception WDL  Hallucination None reported or observed  Judgment Impaired  Confusion None  Danger to Self  Current suicidal ideation? Denies  Danger to Others  Danger to Others None reported or observed   Pt pleasant and optimistic. States that today was a better day than the day before. Denies SI, HI, AVH and pain. Pt takes meds appropriately. Contracts for safety.

## 2019-05-29 NOTE — Progress Notes (Signed)
Recreation Therapy Notes  Date:  1.6.21 Time: 0930 Location: 300 Hall Dayroom  Group Topic: Stress Management  Goal Area(s) Addresses:  Patient will identify positive stress management techniques. Patient will identify benefits of using stress management post d/c.  Intervention: Stress Management  Activity :  Guided Imagery.  LRT read a script that encouraged patients to enjoy the sounds of waves while relaxing on the beach. Patients were to listen and follow along as meditation.  Education:  Stress Management, Discharge Planning.   Education Outcome: Acknowledges Education  Clinical Observations/Feedback: Pt did not attend activity.    Caroll Rancher, LRT/CTRS         Lillia Abed, Dewan Emond A 05/29/2019 11:07 AM

## 2019-05-29 NOTE — Progress Notes (Signed)
   05/29/19 2311  Psych Admission Type (Psych Patients Only)  Admission Status Voluntary  Psychosocial Assessment  Patient Complaints None  Eye Contact Fair  Facial Expression Sad;Anxious  Affect Depressed;Anxious  Speech Logical/coherent  Interaction Assertive  Motor Activity Other (Comment) (WNL)  Appearance/Hygiene Unremarkable  Behavior Characteristics Cooperative  Mood Depressed  Aggressive Behavior  Effect No apparent injury  Thought Process  Coherency WDL  Content WDL  Delusions WDL  Perception WDL  Hallucination None reported or observed  Judgment Impaired  Confusion None  Danger to Self  Current suicidal ideation? Denies  Danger to Others  Danger to Others None reported or observed  Danger to Others Abnormal  Harmful Behavior to others No threats or harm toward other people  Destructive Behavior No threats or harm toward property  D: Patient in dayroom reports he had a good day.  A: Medications administered as prescribed. Support and encouragement provided as needed.  R: Patient remains safe on the unit. Will continue to monitor for safety and stability.

## 2019-05-29 NOTE — [facility] Suicide Risk Assessment (Signed)
[facility] INPATIENT:  Family/Significant Other Suicide Prevention Education  Suicide Prevention Education:  Education Completed; father, Walter Howe [phone] has been identified by the patient as the family member/significant other with whom the patient will be residing, and identified as the person(s) who will aid the patient in the event of a mental health crisis (suicidal ideations/suicide attempt).  With written consent from the patient, the family member/significant other has been provided the following suicide prevention education, prior to the and/or following the discharge of the patient.  The suicide prevention education provided includes the following:  Suicide risk factors  Suicide prevention and interventions  National Suicide Hotline telephone number  [facility] assessment telephone number  [facility] Emergency Assistance 911  [facility] and/or Residential Mobile Crisis Unit telephone number  Request made of family/significant other to:  Remove weapons (e.g., guns, rifles, knives), all items previously/currently identified as safety concern.    Remove drugs/medications (over-the-counter, prescriptions, illicit drugs), all items previously/currently identified as a safety concern.  The family member/significant other verbalizes understanding of the suicide prevention education information provided.  The family member/significant other agrees to remove the items of safety concern listed above.  Father endorses that much of patient's depression relates to medical issues. Father reports he is ready for patient to return home, he has no safety concerns for discharge.  Walter Howe 05/29/2019, 9:08 AM

## 2019-05-29 NOTE — Plan of Care (Signed)
Nurse discussed anxiety, depression and coping skills with patient.  

## 2019-05-29 NOTE — Progress Notes (Signed)
D:  Patient's self inventory sheet, patient sleeps good, sleep medication helpful.  Good appetite, low energy level, good concentration.  Rated depression and hopeless 2, anxiety 4.  Denied withdrawals.  Denied SI.  Denied physical problems.  Denied physical pain.  Goal is discharge plan.  No discharge plan at this time. A:  Medications administered per MD orders.  Emotional support and encouragement given patient. R:  Denied SI and HI, contracts for safety.  Denied A/V hallucinations.  Denied pain.  Safety maintained with 15 minute checks.
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
## 2019-05-30 MED ORDER — TRAZODONE HCL 50 MG PO TABS: 50.0000 mg | ORAL_TABLET | Freq: Every evening | ORAL | 0 refills | Status: DC | PRN

## 2019-05-30 MED ORDER — PRENATAL MULTIVITAMIN CH: 1.0000 | ORAL_TABLET | Freq: Every day | ORAL | Status: DC

## 2019-05-30 MED ORDER — FLUOXETINE HCL 20 MG PO CAPS: 20.0000 mg | ORAL_CAPSULE | Freq: Every day | ORAL | 0 refills | Status: DC

## 2019-05-30 MED ORDER — OLANZAPINE 10 MG PO TABS: 10.0000 mg | ORAL_TABLET | Freq: Every day | ORAL | 0 refills | Status: DC

## 2019-05-30 MED ORDER — OMEGA-3-ACID ETHYL ESTERS 1 G PO CAPS: 1.0000 g | ORAL_CAPSULE | Freq: Two times a day (BID) | ORAL | 0 refills | Status: DC

## 2019-05-30 MED ORDER — HYDROXYZINE HCL 25 MG PO TABS: 25.0000 mg | ORAL_TABLET | Freq: Three times a day (TID) | ORAL | 0 refills | Status: DC | PRN

## 2019-05-30 MED ORDER — NICOTINE POLACRILEX 2 MG MT GUM: 2.0000 mg | CHEWING_GUM | OROMUCOSAL | 0 refills | Status: DC | PRN

## 2019-05-30 NOTE — Progress Notes (Signed)
  [facility] Adult Case Management Discharge Plan :  Will you be returning to the same living situation after discharge:  Yes,  home with family. At discharge, do you have transportation home?: Yes,  father will pick up after 11:30am. Do you have the ability to pay for your medications: No. Referred to [facility] and [facility].  Release of information consent forms completed and in the chart. Patient to Follow up at: Follow-up Information    [facility]. Go on 06/03/2019.   Why: Your hospital follow up appointment for medication management and therapy is Monday, 06/03/19 at 10:00am. This appointment will be held in-person. Please be sure to bring any discharge paperwork and wear a mask! A referral for CST was made on your behalf. Contact information: [address] [place] [address] [phone]        [facility] Follow up on 06/12/2019.   Why: Your new patient appointment for primary care will be held virtually through MyChart on 01/20 at 3:30pm. Please make sure you have created a MyChart appointment prior to your appointment time. For questions, please call the office. Contact information: [address] [place], [place] [address] Phone: [phone] Fax: [phone]          Next level of care provider has access to [facility] Link:yes  Safety Planning and Suicide Prevention discussed: Yes,  with father.   Has patient been referred to the Quitline?: N/A patient is not a smoker  Patient has been referred for addiction treatment: Yes  [name], LCSWA 05/30/2019, 9:13 AM
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
## 2019-05-30 NOTE — Progress Notes (Signed)
Pt discharged to lobby. Pt was stable and appreciative at that time. All papers, samples and prescriptions were given and valuables returned. Verbal understanding expressed. Denies SI/HI and A/VH. Pt given opportunity to express concerns and ask questions.  

## 2019-05-30 NOTE — Progress Notes (Signed)
PT Note  Patient Details Name: Walter Howe MRN: [id number] DOB: 07/28/1985      Noted PT order and chart reviewed , this is really appropriate for OT to see. Pt has been attending OPOT sessions for UE exercises and splinting. Pt should continue with what he has learned at OPOT.   We will try to send over an OT while patient here to assess as time allows.   Please reach out if any questions    Marella Bile 05/30/2019, 11:05 AM  Acute Care Rehab Services Supervisor [phone]

## 2019-06-12 ENCOUNTER — Telehealth (INDEPENDENT_AMBULATORY_CARE_PROVIDER_SITE_OTHER): Payer: Self-pay | Admitting: Primary Care

## 2019-06-12 DIAGNOSIS — Z09 Encounter for follow-up examination after completed treatment for conditions other than malignant neoplasm: Secondary | ICD-10-CM

## 2019-06-12 DIAGNOSIS — S42331G Displaced oblique fracture of shaft of humerus, right arm, subsequent encounter for fracture with delayed healing: Secondary | ICD-10-CM

## 2019-06-12 DIAGNOSIS — Z7689 Persons encountering health services in other specified circumstances: Secondary | ICD-10-CM
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
## 2019-06-27 ENCOUNTER — Ambulatory Visit: Payer: Self-pay | Admitting: Occupational Therapy

## 2019-06-28 ENCOUNTER — Ambulatory Visit: Payer: Self-pay | Admitting: Rehabilitation

## 2019-07-08 ENCOUNTER — Telehealth: Payer: Self-pay | Admitting: Orthopaedic Surgery

## 2019-07-08 NOTE — Telephone Encounter (Signed)
Patient's father called  [facility] needs clarification on whether the patient is to start PT or OT.   Call back number: [phone]

## 2019-07-09 NOTE — Telephone Encounter (Signed)
He was supposed to start back in November.

## 2019-07-11 ENCOUNTER — Ambulatory Visit: Payer: Self-pay | Admitting: Rehabilitation

## 2019-07-12 ENCOUNTER — Other Ambulatory Visit: Payer: Self-pay

## 2019-07-12 DIAGNOSIS — S42331A Displaced oblique fracture of shaft of humerus, right arm, initial encounter for closed fracture: Secondary | ICD-10-CM

## 2019-07-12 NOTE — Telephone Encounter (Signed)
Called no answer LMOM. Order was made for OT.

## 2019-07-12 NOTE — Telephone Encounter (Signed)
As of 11/4 he should had started OT.  "At this point he should begin OT for range of motion of the shoulder elbow and wrist fingers."

## 2019-07-16 ENCOUNTER — Other Ambulatory Visit: Payer: Self-pay

## 2019-07-16 ENCOUNTER — Ambulatory Visit (INDEPENDENT_AMBULATORY_CARE_PROVIDER_SITE_OTHER): Payer: Self-pay

## 2019-07-16 ENCOUNTER — Encounter: Payer: Self-pay | Admitting: Orthopaedic Surgery

## 2019-07-16 ENCOUNTER — Ambulatory Visit (INDEPENDENT_AMBULATORY_CARE_PROVIDER_SITE_OTHER): Payer: Self-pay | Admitting: Orthopaedic Surgery

## 2019-07-16 VITALS — Ht 65.75 in | Wt 113.0 lb

## 2019-07-16 DIAGNOSIS — S42331A Displaced oblique fracture of shaft of humerus, right arm, initial encounter for closed fracture: Secondary | ICD-10-CM

## 2019-07-16 NOTE — Progress Notes (Signed)
Office Visit Note   Patient: Walter Howe           Date of Birth: Oct 16, 1985           MRN: [id number] Visit Date: 07/16/2019              Requested by: No referring provider defined for this encounter. PCP: Patient, No Pcp Per   Assessment & Plan: Visit Diagnoses:  1. Closed displaced oblique fracture of shaft of right humerus, initial encounter     Plan: Impression is 4 months status post ORIF right humerus fracture.  He has recovered some radial nerve function with wrist extension but he is still lacking PIN function with finger extension and the EPL.  At this point we will obtain a nerve conduction study to evaluate the severity of the nerve palsy.  In the meantime he will continue to do home exercises to keep his joints supple.  He will continue to wear his brace.    Follow-Up Instructions: Return in about 4 weeks (around 08/13/2019).   Orders:  Orders Placed This Encounter  Procedures  . XR Humerus Right   No orders of the defined types were placed in this encounter.     Procedures: No procedures performed   Clinical Data: No additional findings.   Subjective: Chief Complaint  Patient presents with  . Right Hand - Follow-up  . Right Arm - Follow-up    S/p right ORIF humerus fracture DOS 03/07/2019    Walter Howe is a 34 year old gentleman who comes in for follow-up of his right humerus fracture that he underwent ORIF in October.  He is 4 months status post the surgery.  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  He has been wearing the splint but he has not been able to attend therapy due to cost.  He reports no pain in his right arm.  He does state that he has regained wrist extension in the meantime.  He still cannot extend his fingers.   Review of Systems   Objective: Vital Signs: Ht 5' 5.75" (1.67 m)   Wt 113 lb (51.3 kg)   BMI 18.38 kg/m   Physical Exam  Ortho Exam Right arm exam shows a fully healed surgical scar.  He has full range of  motion without pain of the shoulder and elbow.  He has regained wrist extension.  He is unable to extend his fingers or activate EPL.  He has decreased sensation of the radial nerve distribution. Specialty Comments:  No specialty comments available.  Imaging: XR Humerus Right  Result Date: 07/16/2019 Fully healed humerus fracture.    PMFS History: Patient Active Problem List   Diagnosis Date Noted  . MDD (major depressive disorder) 05/26/2019  . Suicidal ideations 05/26/2019  . Closed displaced oblique fracture of shaft of right humerus 03/01/2019  . FINGER PAIN 08/28/2007  . FRACTURE, FINGER, PROXIMAL 08/28/2007   Past Medical History:  Diagnosis Date  . Anxiety    03/05/2019- in the past, not current  . History of kidney stones    passed    History reviewed. No pertinent family history.  Past Surgical History:  Procedure Laterality Date  . NO PAST SURGERIES    . ORIF HUMERUS FRACTURE Right 03/07/2019   Procedure: OPEN REDUCTION INTERNAL FIXATION (ORIF) RIGHT HUMERUS;  Surgeon: Tarry Kos, MD;  Location: [facility];  Service: Orthopedics;  Laterality: Right;   Social History   Occupational History  . Not on file  Tobacco Use  . Smoking status: Former Smoker    Quit date: 02/20/2019    Years since quitting: 0.4  . Smokeless tobacco: Never Used  Substance and Sexual Activity  . Alcohol use: Yes    Comment: ocassional  . Drug use: Yes    Comment: vicodin  . Sexual activity: Not on file

## 2019-07-16 NOTE — Addendum Note (Signed)
Addended by: Albertina Parr on: 07/16/2019 09:48 AM   Modules accepted: Orders

## 2019-08-13 ENCOUNTER — Ambulatory Visit (INDEPENDENT_AMBULATORY_CARE_PROVIDER_SITE_OTHER): Payer: Self-pay | Admitting: Orthopaedic Surgery

## 2019-08-13 ENCOUNTER — Other Ambulatory Visit: Payer: Self-pay

## 2019-08-13 ENCOUNTER — Encounter: Payer: Self-pay | Admitting: Orthopaedic Surgery

## 2019-08-13 VITALS — Ht 65.75 in | Wt 113.0 lb

## 2019-08-13 DIAGNOSIS — G5631 Lesion of radial nerve, right upper limb: Secondary | ICD-10-CM

## 2019-08-13 DIAGNOSIS — S42331A Displaced oblique fracture of shaft of humerus, right arm, initial encounter for closed fracture: Secondary | ICD-10-CM

## 2019-08-13 NOTE — Progress Notes (Signed)
   Office Visit Note   Patient: Walter Howe           Date of Birth: 08/10/1985           MRN: [id number] Visit Date: 08/13/2019              Requested by: No referring provider defined for this encounter. PCP: Patient, No Pcp Per   Assessment & Plan: Visit Diagnoses:  1. Closed displaced oblique fracture of shaft of right humerus, initial encounter   2. Right radial nerve palsy     Plan: We will have the patient follow-up with [place] after his nerve conduction studies to go over the findings.  Follow-Up Instructions: Return if symptoms worsen or fail to improve.   Orders:  No orders of the defined types were placed in this encounter.  No orders of the defined types were placed in this encounter.     Procedures: No procedures performed   Clinical Data: No additional findings.   Subjective: Chief Complaint  Patient presents with  . Right Arm - Follow-up    Right ORIF humerus DOS 03/07/2019    Walter Howe is 5 months status post ORIF right humerus fracture.  He did have an associated radial nerve palsy as a result of the injury.  He has his nerve conduction studies scheduled for this Friday with Dr. Alvester Morin.  He has regained wrist extension since the injury.   Review of Systems   Objective: Vital Signs: Ht 5' 5.75" (1.67 m)   Wt 113 lb (51.3 kg)   BMI 18.38 kg/m   Physical Exam  Ortho Exam Right upper extremity shows a fully healed surgical scar.  He has full range of motion of the shoulder elbow wrist fingers.  He does have active wrist extension.  No EPL or EIP function yet.  He does feels a burning sensation in the superficial radial nerve distribution.  Positive Tinel's at the radial tunnel. Specialty Comments:  No specialty comments available.  Imaging: No results found.   PMFS History: Patient Active Problem List   Diagnosis Date Noted  . MDD (major depressive disorder) 05/26/2019  . Suicidal ideations 05/26/2019  . Closed displaced oblique fracture  of shaft of right humerus 03/01/2019  . FINGER PAIN 08/28/2007  . FRACTURE, FINGER, PROXIMAL 08/28/2007   Past Medical History:  Diagnosis Date  . Anxiety    03/05/2019- in the past, not current  . History of kidney stones    passed    History reviewed. No pertinent family history.  Past Surgical History:  Procedure Laterality Date  . NO PAST SURGERIES    . ORIF HUMERUS FRACTURE Right 03/07/2019   Procedure: OPEN REDUCTION INTERNAL FIXATION (ORIF) RIGHT HUMERUS;  Surgeon: Tarry Kos, MD;  Location: [facility];  Service: Orthopedics;  Laterality: Right;   Social History   Occupational History  . Not on file  Tobacco Use  . Smoking status: Former Smoker    Quit date: 02/20/2019    Years since quitting: 0.4  . Smokeless tobacco: Never Used  Substance and Sexual Activity  . Alcohol use: Yes    Comment: ocassional  . Drug use: Yes    Comment: vicodin  . Sexual activity: Not on file

## 2019-08-16 ENCOUNTER — Other Ambulatory Visit: Payer: Self-pay

## 2019-08-16 ENCOUNTER — Ambulatory Visit (INDEPENDENT_AMBULATORY_CARE_PROVIDER_SITE_OTHER): Payer: Self-pay | Admitting: Physical Medicine and Rehabilitation

## 2019-08-16 ENCOUNTER — Encounter: Payer: Self-pay | Admitting: Physical Medicine and Rehabilitation

## 2019-08-16 DIAGNOSIS — R202 Paresthesia of skin: Secondary | ICD-10-CM

## 2019-08-16 NOTE — Progress Notes (Signed)
  Numeric Pain Rating Scale and Functional Assessment Average Pain 4   In the last MONTH (on 0-10 scale) has pain interfered with the following?  1. General activity like being  able to carry out your everyday physical activities such as walking, climbing stairs, carrying groceries, or moving a chair?  Rating(5)   

## 2019-08-19 NOTE — Progress Notes (Signed)
Walter Howe - 34 y.o. adult MRN [id number]  Date of birth: [birth year]  Office Visit Note: Visit Date: 08/16/2019 PCP: Patient, No Pcp Per Referred by: Tarry Kos, MD  Subjective: Chief Complaint  Patient presents with  . Right Hand - Pain   HPI: Walter Howe is a 34 y.o. adult who comes in today For electrodiagnostic study of the right upper limb at the request of Dr. Glee Arvin.  Patient is right-hand dominant with history of closed displaced humeral shaft fracture.  Details of the incidents and findings around that fracture can be reviewed in Dr. Warren Danes notes.  He was examined by Dr. Roda Shutters prior to open reduction and internal fixation surgery which was on 1015.  The office visit prior to that did show that he was having a radial nerve palsy.  He has had no prior electrodiagnostic studies.  He does not endorse any specific radicular complaints.  He does endorse a burning sensation in the posterior aspect of the hand on the dorsum of the hand but is not endorse any real numbness.  He reports not being able to extend his thumb or index finger.  Initially he could not extend his wrist but he can do that now.  ROS Otherwise per HPI.  Assessment & Plan: Visit Diagnoses:  1. 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  This appears to show continued denervation distally but there is some background motor units that seem to be activated.  The nerve conduction study is somewhat error prone data the fact that we measure over the EIP and there is atrophy in this muscle.  Recommendations: 1.  Follow-up with referring physician. 2.  Consider repeat electrodiagnostic study in 3 to 4 months of no improvement or worsening.  Meds & Orders: No orders of the defined types were placed in this encounter.   Orders Placed This Encounter    Procedures  . NCV with EMG (electromyography)    Follow-up: Return for Glee Arvin, MD.   Procedures: No procedures performed  EMG & NCV Findings: Evaluation of the right radial motor nerve showed prolonged distal onset latency (6.2 ms), reduced amplitude (0.1 mV), and decreased conduction velocity (Up Arm-8cm, 18 m/s).  The right median (across palm) sensory nerve showed prolonged distal peak latency (Wrist, 3.8 ms) and prolonged distal peak latency (Palm, 2.1 ms).  The right radial sensory nerve showed no response (Wrist).  All remaining nerves (as indicated in the following tables) were within normal limits.    Needle evaluation of the right extensor indicis muscle showed increased insertional activity, widespread spontaneous activity, diminished recruitment, and very decreased interference pattern.  The right extensor digitorum communis muscle showed increased insertional activity, moderately increased spontaneous activity, and diminished recruitment.  The right brachioradialis muscle showed increased insertional activity and slightly increased spontaneous activity.  The right extensor pollicis longus muscle showed decreased insertional activity and widespread spontaneous activity.  All remaining muscles (as indicated in the following table) showed no evidence of electrical instability.    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  This appears to show continued denervation distally but there is some background motor units that seem to be activated.  The nerve conduction study is somewhat error prone data  the fact that we measure over the EIP and there is atrophy in this muscle.  Recommendations: 1.  Follow-up with referring physician. 2.  Consider repeat electrodiagnostic study in 3 to 4 months of no improvement or worsening. ___________________________ Naaman Plummer [facility] Board Certified, American Board of Physical Medicine and Rehabilitation    Nerve Conduction Studies Anti Sensory Summary Table   Stim Site NR Peak (ms) Norm Peak (ms) P-T Amp (V) Norm P-T Amp Site1 Site2 Delta-P (ms) Dist (cm) Vel (m/s) Norm Vel (m/s)  Right Median Acr Palm Anti Sensory (2nd Digit)  30.5C  Wrist    *3.8 <3.6 28.7 >10 Wrist Palm 1.7 0.0    Palm    *2.1 <2.0 41.2         Right Radial Anti Sensory (Base 1st Digit)  30.1C  Wrist *NR  <3.1   Wrist Base 1st Digit  0.0     Motor Summary Table   Stim Site NR Onset (ms) Norm Onset (ms) O-P Amp (mV) Norm O-P Amp Site1 Site2 Delta-0 (ms) Dist (cm) Vel (m/s) Norm Vel (m/s)  Right Median Motor (Abd Poll Brev)  30.2C  Wrist    3.8 <4.2 11.1 >5 Elbow Wrist 4.0 20.0 50 >50  Elbow    7.8  11.3         Right Radial Motor (Ext Indicis)  29.6C  8cm    *6.2 <2.5 *0.1 >1.7 Up Arm 8cm 10.5 18.5 *18 >60  Up Arm    16.7  0.2          EMG   Side Muscle Nerve Root Ins Act Fibs Psw Amp Dur Poly Recrt Int Dennie Bible Comment  Right Abd Poll Brev Median C8-T1 Nml Nml Nml Nml Nml 0 Nml Nml   Right 1stDorInt Ulnar C8-T1 Nml Nml Nml Nml Nml 0 Nml Nml   Right ExtIndicis Radial (Post Int) C7-8 *CRD *4+ *4+ Nml Nml 0 *Reduced *25% rare muap  Right ExtDigCom   *CRD *2+ *2+ Nml Nml 0 *Reduced Nml   Right BrachioRad Radial C5-6 *Incr *1+ *1+ Nml Nml 0 Nml Nml   Right Triceps Radial C6-7-8 Nml Nml Nml Nml Nml 0 Nml Nml   Right Deltoid Axillary C5-6 Nml Nml Nml Nml Nml 0 Nml Nml   Right Ext Poll Long Radial (Post Int) C7-8 *Decr *4+ *4+ Nml Nml 0 Nml Nml background activity    Nerve Conduction Studies Anti Sensory Left/Right Comparison   Stim Site L Lat (ms) R Lat (ms) L-R Lat (ms) L Amp (V) R Amp (V) L-R Amp (%) Site1 Site2 L Vel (m/s) R Vel (m/s) L-R Vel (m/s)  Median Acr Palm Anti Sensory (2nd Digit)  30.5C  Wrist  *3.8   28.7  Wrist Palm     Palm  *2.1   41.2        Radial Anti Sensory (Base 1st Digit)  30.1C  Wrist        Wrist Base 1st Digit      Motor Left/Right Comparison   Stim Site L Lat (ms) R Lat (ms) L-R Lat (ms) L Amp (mV) R Amp (mV) L-R Amp (%) Site1 Site2 L Vel (m/s) R Vel (m/s) L-R Vel (m/s)  Median Motor (Abd Poll Brev)  30.2C  Wrist  3.8   11.1  Elbow Wrist  50   Elbow  7.8   11.3        Radial Motor (Ext Indicis)  29.6C  8cm  *6.2   *0.1  Up Arm 8cm  *18   Up Arm  16.7   0.2           Waveforms:             Clinical History: No specialty comments available.   He reports that he quit smoking about 6 months ago. He has never used smokeless tobacco.  Recent Labs    05/27/19 0633  HGBA1C 4.9    Objective:  VS:  HT:    WT:   BMI:     BP:   HR: bpm  TEMP: ( )  RESP:  Physical Exam Musculoskeletal:        General: No swelling, tenderness or deformity.     Comments: Inspection reveals well-healed surgical scar on the upper right arm.  Patient is very thin in general.  No atrophy of the bilateral APB or FDI or hand intrinsics.  There is some atrophy of the right forearm extensor muscles compared to left there is no swelling, color changes, allodynia or dystrophic changes. There is 5 out of 5 strength in the bilateral finger abduction and long finger flexion.  He does have some ulnar deviation with wrist extension on the right but he does have good strength there.  He does have the inability to fully extend the index finger and the thumb or the EPL.  He seems to be able to extend the other fingers fairly well.  Sensation seems to be somewhat impaired on the dorsum of the hand with paresthesia.  There is a negative Froment's test bilaterally. There is a negative Tinel's test at the bilateral wrist and elbow. There is a negative Phalen's test bilaterally. There is a negative Hoffmann's test bilaterally.  Skin:    General: Skin is warm and dry.     Findings: No erythema or rash.  Neurological:     General: No focal deficit present.     Mental Status: He is alert and oriented to  person, place, and time.     Motor: No weakness or abnormal muscle tone.     Coordination: Coordination normal.  Psychiatric:        Mood and Affect: Mood normal.        Behavior: Behavior normal.     Ortho Exam Imaging: No results found.  Past Medical/Family/Surgical/Social History: Medications & Allergies reviewed per EMR, new medications updated. Patient Active Problem List   Diagnosis Date Noted  . MDD (major depressive disorder) 05/26/2019  . Suicidal ideations 05/26/2019  . Closed displaced oblique fracture of shaft of right humerus 03/01/2019  . FINGER PAIN 08/28/2007  . FRACTURE, FINGER, PROXIMAL 08/28/2007   Past Medical History:  Diagnosis Date  . Anxiety    03/05/2019- in the past, not current  . History of kidney stones    passed   History reviewed. No pertinent family history. Past Surgical History:  Procedure Laterality Date  . NO PAST SURGERIES    . ORIF HUMERUS FRACTURE Right 03/07/2019   Procedure: OPEN REDUCTION INTERNAL FIXATION (ORIF) RIGHT HUMERUS;  Surgeon: Leandrew Koyanagi, MD;  Location: [facility];  Service: Orthopedics;  Laterality: Right;   Social History   Occupational History  . Not on file  Tobacco Use  . Smoking status: Former Smoker    Quit date: 02/20/2019    Years since quitting: 0.5  . Smokeless tobacco: Never Used  Substance and Sexual Activity  . Alcohol use: Yes    Comment: ocassional  . Drug use: Yes    Comment:  vicodin  . Sexual activity: Not on file

## 2019-08-26 ENCOUNTER — Telehealth: Payer: Self-pay | Admitting: *Deleted

## 2019-08-27 ENCOUNTER — Other Ambulatory Visit: Payer: Self-pay

## 2019-08-27 ENCOUNTER — Ambulatory Visit (INDEPENDENT_AMBULATORY_CARE_PROVIDER_SITE_OTHER): Payer: Self-pay | Admitting: Orthopaedic Surgery

## 2019-08-27 ENCOUNTER — Encounter: Payer: Self-pay | Admitting: Orthopaedic Surgery

## 2019-08-27 DIAGNOSIS — G5631 Lesion of radial nerve, right upper limb: Secondary | ICD-10-CM

## 2019-08-27 NOTE — Telephone Encounter (Signed)
Yes, I can't copy and paste now from home from my [place] so had to do from work

## 2019-08-27 NOTE — Progress Notes (Signed)
   Office Visit Note   Patient: Walter Howe           Date of Birth: 03/31/1986           MRN: [id number] Visit Date: 08/27/2019              Requested by: No referring provider defined for this encounter. PCP: Patient, No Pcp Per   Assessment & Plan: Visit Diagnoses:  1. Right radial nerve palsy     Plan: Nerve conduction studies are consistent with a severe right radial nerve neuropathy that is affecting sensory and motor components.  We will recheck the patient in 3 months.  In the meantime he will continue with exercises to maintain joint range of motion.  Follow-Up Instructions: Return in about 3 months (around 11/26/2019).   Orders:  No orders of the defined types were placed in this encounter.  No orders of the defined types were placed in this encounter.     Procedures: No procedures performed   Clinical Data: No additional findings.   Subjective: Chief Complaint  Patient presents with  . Right Hand - Pain, Follow-up    Alford is here today with his father for nerve conduction review.  No changes reported.   Review of Systems   Objective: Vital Signs: There were no vitals taken for this visit.  Physical Exam  Ortho Exam Exam is unchanged. He has active wrist extension.  No EPL or EIP or EDC function.  Intact sensation to the radial nerve distribution. Specialty Comments:  No specialty comments available.  Imaging: No results found.   PMFS History: Patient Active Problem List   Diagnosis Date Noted  . MDD (major depressive disorder) 05/26/2019  . Suicidal ideations 05/26/2019  . Closed displaced oblique fracture of shaft of right humerus 03/01/2019  . FINGER PAIN 08/28/2007  . FRACTURE, FINGER, PROXIMAL 08/28/2007   Past Medical History:  Diagnosis Date  . Anxiety    03/05/2019- in the past, not current  . History of kidney stones    passed    History reviewed. No pertinent family history.  Past Surgical History:  Procedure  Laterality Date  . NO PAST SURGERIES    . ORIF HUMERUS FRACTURE Right 03/07/2019   Procedure: OPEN REDUCTION INTERNAL FIXATION (ORIF) RIGHT HUMERUS;  Surgeon: Tarry Kos, MD;  Location: [facility];  Service: Orthopedics;  Laterality: Right;   Social History   Occupational History  . Not on file  Tobacco Use  . Smoking status: Former Smoker    Quit date: 02/20/2019    Years since quitting: 0.5  . Smokeless tobacco: Never Used  Substance and Sexual Activity  . Alcohol use: Yes    Comment: ocassional  . Drug use: Yes    Comment: vicodin  . Sexual activity: Not on file

## 2019-08-27 NOTE — Procedures (Signed)
EMG & NCV Findings: Evaluation of the right radial motor nerve showed prolonged distal onset latency (6.2 ms), reduced amplitude (0.1 mV), and decreased conduction velocity (Up Arm-8cm, 18 m/s).  The right median (across palm) sensory nerve showed prolonged distal peak latency (Wrist, 3.8 ms) and prolonged distal peak latency (Palm, 2.1 ms).  The right radial sensory nerve showed no response (Wrist).  All remaining nerves (as indicated in the following tables) were within normal limits.    Needle evaluation of the right extensor indicis muscle showed increased insertional activity, widespread spontaneous activity, diminished recruitment, and very decreased interference pattern.  The right extensor digitorum communis muscle showed increased insertional activity, moderately increased spontaneous activity, and diminished recruitment.  The right brachioradialis muscle showed increased insertional activity and slightly increased spontaneous activity.  The right extensor pollicis longus muscle showed decreased insertional activity and widespread spontaneous activity.  All remaining muscles (as indicated in the following table) showed no evidence of electrical instability.    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  This appears to show continued denervation distally but there is some background motor units that seem to be activated.  The nerve conduction study is somewhat error prone data the fact that we measure over the EIP and there is atrophy in this muscle.  Recommendations: 1.  Follow-up with referring physician. 2.  Consider repeat electrodiagnostic study in 3 to 4 months of no improvement or worsening. ___________________________ Naaman Plummer [facility] Board Certified, American Board of Physical Medicine and Rehabilitation    Nerve Conduction Studies Anti Sensory  Summary Table   Stim Site NR Peak (ms) Norm Peak (ms) P-T Amp (V) Norm P-T Amp Site1 Site2 Delta-P (ms) Dist (cm) Vel (m/s) Norm Vel (m/s)  Right Median Acr Palm Anti Sensory (2nd Digit)  30.5C  Wrist    *3.8 <3.6 28.7 >10 Wrist Palm 1.7 0.0    Palm    *2.1 <2.0 41.2         Right Radial Anti Sensory (Base 1st Digit)  30.1C  Wrist *NR  <3.1   Wrist Base 1st Digit  0.0     Motor Summary Table   Stim Site NR Onset (ms) Norm Onset (ms) O-P Amp (mV) Norm O-P Amp Site1 Site2 Delta-0 (ms) Dist (cm) Vel (m/s) Norm Vel (m/s)  Right Median Motor (Abd Poll Brev)  30.2C  Wrist    3.8 <4.2 11.1 >5 Elbow Wrist 4.0 20.0 50 >50  Elbow    7.8  11.3         Right Radial Motor (Ext Indicis)  29.6C  8cm    *6.2 <2.5 *0.1 >1.7 Up Arm 8cm 10.5 18.5 *18 >60  Up Arm    16.7  0.2          EMG   Side Muscle Nerve Root Ins Act Fibs Psw Amp Dur Poly Recrt Int Dennie Bible Comment  Right Abd Poll Brev Median C8-T1 Nml Nml Nml Nml Nml 0 Nml Nml   Right 1stDorInt Ulnar C8-T1 Nml Nml Nml Nml Nml 0 Nml Nml   Right ExtIndicis Radial (Post Int) C7-8 *CRD *4+ *4+ Nml Nml 0 *Reduced *25% rare muap  Right ExtDigCom   *CRD *2+ *2+ Nml Nml 0 *Reduced Nml   Right BrachioRad Radial C5-6 *Incr *1+ *1+ Nml Nml 0 Nml Nml   Right Triceps Radial C6-7-8 Nml Nml Nml Nml Nml 0 Nml Nml  Right Deltoid Axillary C5-6 Nml Nml Nml Nml Nml 0 Nml Nml   Right Ext Poll Long Radial (Post Int) C7-8 *Decr *4+ *4+ Nml Nml 0 Nml Nml background activity    Nerve Conduction Studies Anti Sensory Left/Right Comparison   Stim Site L Lat (ms) R Lat (ms) L-R Lat (ms) L Amp (V) R Amp (V) L-R Amp (%) Site1 Site2 L Vel (m/s) R Vel (m/s) L-R Vel (m/s)  Median Acr Palm Anti Sensory (2nd Digit)  30.5C  Wrist  *3.8   28.7  Wrist Palm     Palm  *2.1   41.2        Radial Anti Sensory (Base 1st Digit)  30.1C  Wrist       Wrist Base 1st Digit      Motor Left/Right Comparison   Stim Site L Lat (ms) R Lat (ms) L-R Lat (ms) L Amp (mV) R Amp (mV) L-R Amp  (%) Site1 Site2 L Vel (m/s) R Vel (m/s) L-R Vel (m/s)  Median Motor (Abd Poll Brev)  30.2C  Wrist  3.8   11.1  Elbow Wrist  50   Elbow  7.8   11.3        Radial Motor (Ext Indicis)  29.6C  8cm  *6.2   *0.1  Up Arm 8cm  *18   Up Arm  16.7   0.2           Waveforms:

## 2019-11-07 ENCOUNTER — Telehealth (INDEPENDENT_AMBULATORY_CARE_PROVIDER_SITE_OTHER): Payer: No Payment, Other | Admitting: Psychiatric/Mental Health

## 2019-11-07 ENCOUNTER — Other Ambulatory Visit: Payer: Self-pay

## 2019-11-07 ENCOUNTER — Encounter (HOSPITAL_COMMUNITY): Payer: Self-pay | Admitting: Psychiatric/Mental Health

## 2019-11-07 DIAGNOSIS — F17290 Nicotine dependence, other tobacco product, uncomplicated: Secondary | ICD-10-CM

## 2019-11-07 DIAGNOSIS — F4310 Post-traumatic stress disorder, unspecified: Secondary | ICD-10-CM

## 2019-11-07 DIAGNOSIS — F250 Schizoaffective disorder, bipolar type: Secondary | ICD-10-CM

## 2019-11-07 DIAGNOSIS — F333 Major depressive disorder, recurrent, severe with psychotic symptoms: Secondary | ICD-10-CM | POA: Diagnosis not present
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
## 2019-11-27 ENCOUNTER — Ambulatory Visit: Payer: Self-pay | Admitting: Orthopaedic Surgery

## 2019-12-18 ENCOUNTER — Encounter (HOSPITAL_COMMUNITY): Payer: Self-pay | Admitting: Emergency Medicine

## 2019-12-18 ENCOUNTER — Other Ambulatory Visit: Payer: Self-pay

## 2019-12-18 ENCOUNTER — Ambulatory Visit (HOSPITAL_COMMUNITY)
Admission: EM | Admit: 2019-12-18 | Discharge: 2019-12-19 | Disposition: A | Discharge status: Home / Self Care | Payer: No Payment, Other | Attending: Behavioral Health | Admitting: Behavioral Health

## 2019-12-18 DIAGNOSIS — Z79899 Other long term (current) drug therapy: Secondary | ICD-10-CM | POA: Insufficient documentation

## 2019-12-18 DIAGNOSIS — Z87891 Personal history of nicotine dependence: Secondary | ICD-10-CM | POA: Insufficient documentation

## 2019-12-18 DIAGNOSIS — F329 Major depressive disorder, single episode, unspecified: Secondary | ICD-10-CM | POA: Insufficient documentation

## 2019-12-18 DIAGNOSIS — Z20822 Contact with and (suspected) exposure to covid-19: Secondary | ICD-10-CM | POA: Insufficient documentation

## 2019-12-18 DIAGNOSIS — F4310 Post-traumatic stress disorder, unspecified: Secondary | ICD-10-CM | POA: Insufficient documentation

## 2019-12-18 DIAGNOSIS — F250 Schizoaffective disorder, bipolar type: Secondary | ICD-10-CM | POA: Insufficient documentation

## 2019-12-18 NOTE — ED Triage Notes (Signed)
EMS transport, pt presents with anxiety and paranoia.

## 2019-12-18 NOTE — ED Notes (Signed)
Pt belongings in locker #26 

## 2019-12-19 LAB — LIPID PANEL
Cholesterol: 241 mg/dL — ABNORMAL HIGH (ref 0–200)
HDL: 74 mg/dL (ref >40)
LDL Cholesterol: 146 mg/dL — ABNORMAL HIGH (ref 0–99)
Total CHOL/HDL Ratio: 3.3 RATIO
Triglycerides: 107 mg/dL (ref <150)
VLDL: 21 mg/dL (ref 0–40)

## 2019-12-19 LAB — POCT URINE DRUG SCREEN - MANUAL ENTRY (I-SCREEN)
POC Amphetamine UR: POSITIVE — AB
POC Buprenorphine (BUP): NOT DETECTED
POC Cocaine UR: NOT DETECTED
POC Marijuana UR: NOT DETECTED
POC Methadone UR: NOT DETECTED
POC Methamphetamine UR: POSITIVE — AB
POC Morphine: NOT DETECTED
POC Oxazepam (BZO): NOT DETECTED
POC Oxycodone UR: NOT DETECTED
POC Secobarbital (BAR): NOT DETECTED

## 2019-12-19 LAB — POC SARS CORONAVIRUS 2 AG: SARS Coronavirus 2 Ag: NEGATIVE

## 2019-12-19 LAB — VALPROIC ACID LEVEL: Valproic Acid Lvl: <10 ug/mL — ABNORMAL LOW (ref 50.0–100.0)

## 2019-12-19 LAB — POCT URINALYSIS DIP (DEVICE)
Glucose, UA: NEGATIVE mg/dL
Ketones, ur: NEGATIVE mg/dL
Nitrite: NEGATIVE
Protein, ur: 30 mg/dL — AB
Specific Gravity, Urine: >=1.03 (ref 1.005–1.030)
Urobilinogen, UA: 0.2 mg/dL (ref 0.0–1.0)
pH: 6 (ref 5.0–8.0)

## 2019-12-19 LAB — COMPREHENSIVE METABOLIC PANEL
ALT: 17 U/L (ref 0–44)
AST: 36 U/L (ref 15–41)
Albumin: 4.8 g/dL (ref 3.5–5.0)
Alkaline Phosphatase: 72 U/L (ref 38–126)
Anion gap: 16 — ABNORMAL HIGH (ref 5–15)
BUN: 20 mg/dL (ref 6–20)
CO2: 25 mmol/L (ref 22–32)
Calcium: 10.7 mg/dL — ABNORMAL HIGH (ref 8.9–10.3)
Chloride: 95 mmol/L — ABNORMAL LOW (ref 98–111)
Creatinine, Ser: 1.08 mg/dL (ref 0.61–1.24)
GFR calc Af Amer: >60 mL/min (ref >60)
GFR calc non Af Amer: >60 mL/min (ref >60)
Glucose, Bld: 82 mg/dL (ref 70–99)
Potassium: 3 mmol/L — ABNORMAL LOW (ref 3.5–5.1)
Sodium: 136 mmol/L (ref 135–145)
Total Bilirubin: 1 mg/dL (ref 0.3–1.2)
Total Protein: 8 g/dL (ref 6.5–8.1)

## 2019-12-19 LAB — MAGNESIUM: Magnesium: 2.1 mg/dL (ref 1.7–2.4)

## 2019-12-19 LAB — HEMOGLOBIN A1C
Hgb A1c MFr Bld: 4.7 % — ABNORMAL LOW (ref 4.8–5.6)
Mean Plasma Glucose: 88.19 mg/dL

## 2019-12-19 LAB — SARS CORONAVIRUS 2 BY RT PCR (HOSPITAL ORDER, PERFORMED IN ~~LOC~~ HOSPITAL LAB): SARS Coronavirus 2: NEGATIVE

## 2019-12-19 LAB — ETHANOL: Alcohol, Ethyl (B): <10 mg/dL (ref <10)

## 2019-12-19 LAB — TSH: TSH: 4.728 u[IU]/mL — ABNORMAL HIGH (ref 0.350–4.500)

## 2019-12-19 LAB — GLUCOSE, CAPILLARY: Glucose-Capillary: 149 mg/dL — ABNORMAL HIGH (ref 70–99)

## 2019-12-19 MED ORDER — OLANZAPINE 10 MG IM SOLR
10.0000 mg | Freq: Once | INTRAMUSCULAR | Status: AC
  Administered 2019-12-19: 10 mg via INTRAMUSCULAR

## 2019-12-19 MED ORDER — ACETAMINOPHEN 325 MG PO TABS: 650.0000 mg | ORAL_TABLET | Freq: Four times a day (QID) | ORAL | Status: DC | PRN

## 2019-12-19 MED ORDER — POTASSIUM CHLORIDE CRYS ER 20 MEQ PO TBCR
40.0000 meq | EXTENDED_RELEASE_TABLET | Freq: Once | ORAL | Status: AC
  Administered 2019-12-19: 40 meq via ORAL
  Filled 2019-12-19: qty 2

## 2019-12-19 MED ORDER — FLUOXETINE HCL 20 MG PO CAPS
20.0000 mg | ORAL_CAPSULE | Freq: Every day | ORAL | Status: DC
  Administered 2019-12-19: 20 mg via ORAL
  Filled 2019-12-19: qty 1

## 2019-12-19 MED ORDER — HYDROXYZINE HCL 25 MG PO TABS: 25.0000 mg | ORAL_TABLET | Freq: Three times a day (TID) | ORAL | Status: DC | PRN

## 2019-12-19 MED ORDER — OLANZAPINE 10 MG PO TABS
20.0000 mg | ORAL_TABLET | Freq: Once | ORAL | Status: AC
  Administered 2019-12-19: 20 mg via ORAL
  Filled 2019-12-19: qty 2

## 2019-12-19 MED ORDER — BUSPIRONE HCL 5 MG PO TABS
5.0000 mg | ORAL_TABLET | Freq: Three times a day (TID) | ORAL | Status: DC
  Administered 2019-12-19: 5 mg via ORAL
  Filled 2019-12-19: qty 1

## 2019-12-19 MED ORDER — OLANZAPINE 10 MG IM SOLR
INTRAMUSCULAR | Status: AC
  Filled 2019-12-19: qty 10

## 2019-12-19 MED ORDER — TRAZODONE HCL 50 MG PO TABS: 50.0000 mg | ORAL_TABLET | Freq: Every evening | ORAL | Status: DC | PRN

## 2019-12-19 MED ORDER — DIVALPROEX SODIUM 250 MG PO DR TAB
250.0000 mg | DELAYED_RELEASE_TABLET | Freq: Three times a day (TID) | ORAL | Status: DC
  Administered 2019-12-19: 250 mg via ORAL
  Filled 2019-12-19: qty 1

## 2019-12-19 NOTE — ED Notes (Signed)
Pt took meds PO without any issues noted. Will continue on unit for safety

## 2019-12-19 NOTE — ED Notes (Signed)
Pt resting comfortably at present, no distress noted.  Monitoring for safety. 

## 2019-12-19 NOTE — ED Notes (Signed)
Pt stable at time of dc. Pt escorted outside to safe transportation by [facility]. No issues noted

## 2019-12-19 NOTE — ED Notes (Signed)
Patient talking to  Nurse about being discharged.
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
## 2019-12-19 NOTE — ED Notes (Signed)
Patient had blood drawn and talk on the telephone.

## 2019-12-19 NOTE — ED Notes (Signed)
Provider notified of low bp. No new orders received at this time

## 2019-12-19 NOTE — ED Notes (Signed)
Pt resting comfortably at present, no distress noted. 

## 2019-12-19 NOTE — ED Notes (Signed)
D/c pt home. Pt stated he needed ride. Will call safe transportation for ride home.

## 2019-12-19 NOTE — ED Notes (Signed)
Safe transportation called 

## 2019-12-19 NOTE — ED Notes (Signed)
CBG 149. 

## 2019-12-19 NOTE — ED Provider Notes (Signed)
Behavioral Health Admission [facility]&P [facility] & OBS)  Date: 12/19/19 Patient Name: Walter Howe MRN: [id number] Chief Complaint:  Chief Complaint  Patient presents with  . Anxiety  . Paranoid      Diagnoses:  Final diagnoses:  None    HPI:   Walter Howe is a 34 y.o. patient with a psych history of schizophrenia, PTSD and MDD who presents to [facility] brought in by EMS. Pt presents anxious and disoriented to time and place. Pt shows thought blocking, tangential speech and disorganized thought process. Pt was unable to state why he was brought in to [facility]. Pt states "I called the police because I felt I was in danger". Pt denies SI, he endorses auditory and visual hallucination. Pt states he sees lots of lights and hears cars and dogs barking. Pt was unable to provide anymore information.    PHQ 2-9:     Admission (Discharged) from OP [facility]  C-SSRS RISK CATEGORY High Risk       Total Time spent with patient: 30 minutes  Musculoskeletal  Strength & Muscle Tone: within normal limits Gait & Station: normal Patient leans: N/A  Psychiatric Specialty Exam  Presentation General Appearance: Casual;Fairly Groomed  Eye Contact:Poor  Speech:Blocked  Speech Volume:Normal  Handedness:Right   Mood and Affect  Mood:Anxious  Affect:Congruent   Thought Process  Thought Processes:Disorganized  Descriptions of Associations:Tangential  Orientation:Partial  Thought Content:Scattered  Hallucinations:Hallucinations: Auditory;Visual  Ideas of Reference:Paranoia  Suicidal Thoughts:Suicidal Thoughts: No  Homicidal Thoughts:Homicidal Thoughts: No   Sensorium  Memory:Recent Poor;Immediate Poor  Judgment:Poor  Insight:Poor   Executive Functions  Concentration:Poor  Attention Span:Poor  Recall:Poor  Fund of Knowledge:Fair  Language:Fair   Psychomotor Activity  Psychomotor Activity:Psychomotor Activity:  Normal   Assets  Assets:Communication Skills;Housing;Physical Health   Sleep  Sleep:Sleep: Poor   Physical Exam ROS  Blood pressure (!) 127/88, pulse (!) 130, temperature 98.6 F (37 C), temperature source Oral, resp. rate 20, SpO2 96 %. There is no height or weight on file to calculate BMI.  Past Psychiatric History: Schizophrenia disorder, PTSD and MDD  Is the patient at risk to self? Yes  Has the patient been a risk to self in the past 6 months? No .    Has the patient been a risk to self within the distant past? No   Is the patient a risk to others? No   Has the patient been a risk to others in the past 6 months? No   Has the patient been a risk to others within the distant past? No   Past Medical History:  Past Medical History:  Diagnosis Date  . Anxiety    03/05/2019- in the past, not current  . History of kidney stones    passed    Past Surgical History:  Procedure Laterality Date  . NO PAST SURGERIES    . ORIF HUMERUS FRACTURE Right 03/07/2019   Procedure: OPEN REDUCTION INTERNAL FIXATION (ORIF) RIGHT HUMERUS;  Surgeon: Tarry Kos, MD;  Location: [facility] OR;  Service: Orthopedics;  Laterality: Right;    Family History: History reviewed. No pertinent family history.  Social History:  Social History   Socioeconomic History  . Marital status: Single    Spouse name: Not on file  . Number of children: Not on file  . Years of education: Not on file  . Highest education level: Not on file  Occupational History  . Not on file  Tobacco  Use  . Smoking status: Former Smoker    Quit date: 02/20/2019    Years since quitting: 0.8  . Smokeless tobacco: Never Used  Vaping Use  . Vaping Use: Every day  . Devices: used i n attempt to stop smoking- did n ot work , quit vaping- 2019  Substance and Sexual Activity  . Alcohol use: Yes    Comment: ocassional  . Drug use: Yes    Comment: vicodin  . Sexual activity: Not on file  Other Topics Concern  . Not on file   Social History Narrative  . Not on file   Social Determinants of Health   Financial Resource Strain:   . Difficulty of Paying Living Expenses:   Food Insecurity:   . Worried About Programme researcher, broadcasting/film/videounning Out of Food in the Last Year:   . Baristaan Out of Food in the Last Year:   Transportation Needs:   . Freight forwarderLack of Transportation (Medical):   Marland Kitchen. Lack of Transportation (Non-Medical):   Physical Activity:   . Days of Exercise per Week:   . Minutes of Exercise per Session:   Stress:   . Feeling of Stress :   Social Connections:   . Frequency of Communication with Friends and Family:   . Frequency of Social Gatherings with Friends and Family:   . Attends Religious Services:   . Active Member of Clubs or Organizations:   . Attends BankerClub or Organization Meetings:   Marland Kitchen. Marital Status:   Intimate Partner Violence:   . Fear of Current or Ex-Partner:   . Emotionally Abused:   Marland Kitchen. Physically Abused:   . Sexually Abused:     SDOH:  SDOH Screenings   Alcohol Screen: Low Risk   . Last Alcohol Screening Score (AUDIT): 2  Depression (PHQ2-9):   . PHQ-2 Score:   Financial Resource Strain:   . Difficulty of Paying Living Expenses:   Food Insecurity:   . Worried About Programme researcher, broadcasting/film/videounning Out of Food in the Last Year:   . [employer] Financialan Out of Food in the Last Year:   Housing:   . Last Housing Risk Score:   Physical Activity:   . Days of Exercise per Week:   . Minutes of Exercise per Session:   Social Connections:   . Frequency of Communication with Friends and Family:   . Frequency of Social Gatherings with Friends and Family:   . Attends Religious Services:   . Active Member of Clubs or Organizations:   . Attends BankerClub or Organization Meetings:   Marland Kitchen. Marital Status:   Stress:   . Feeling of Stress :   Tobacco Use: Medium Risk  . Smoking Tobacco Use: Former Smoker  . Smokeless Tobacco Use: Never Used  Transportation Needs:   . Freight forwarderLack of Transportation (Medical):   Marland Kitchen. Lack of Transportation (Non-Medical):     Last Labs:  No  visits with results within 6 Month(s) from this visit.  Latest known visit with results is:  Admission on 05/26/2019, Discharged on 05/30/2019  Component Date Value Ref Range Status  . SARS Coronavirus 2 by RT PCR 05/26/2019 NEGATIVE  NEGATIVE Final   Comment: (NOTE) SARS-CoV-2 target nucleic acids are NOT DETECTED. The SARS-CoV-2 RNA is generally detectable in upper respiratoy specimens during the acute phase of infection. The lowest concentration of SARS-CoV-2 viral copies this assay can detect is 131 copies/mL. A negative result does not preclude SARS-Cov-2 infection and should not be used as the sole basis for treatment or other patient management decisions. A  negative result may occur with  improper specimen collection/handling, submission of specimen other than nasopharyngeal swab, presence of viral mutation(s) within the areas targeted by this assay, and inadequate number of viral copies (<131 copies/mL). A negative result must be combined with clinical observations, patient history, and epidemiological information. The expected result is Negative. Fact Sheet for Patients:  https://www.moore.com/ Fact Sheet for Healthcare Providers:  https://www.young.biz/ This test is not yet ap                          proved or cleared by the [place] FDA and  has been authorized for detection and/or diagnosis of SARS-CoV-2 by FDA under an Emergency Use Authorization (EUA). This EUA will remain  in effect (meaning this test can be used) for the duration of the COVID-19 declaration under Section 564(b)(1) of the Act, 21 U.S.C. section 360bbb-3(b)(1), unless the authorization is terminated or revoked sooner.   . Influenza A by PCR 05/26/2019 NEGATIVE  NEGATIVE Final  . Influenza B by PCR 05/26/2019 NEGATIVE  NEGATIVE Final   Comment: (NOTE) The Xpert Xpress SARS-CoV-2/FLU/RSV assay is intended as an aid in  the diagnosis of influenza from  Nasopharyngeal swab specimens and  should not be used as a sole basis for treatment. Nasal washings and  aspirates are unacceptable for Xpert Xpress SARS-CoV-2/FLU/RSV  testing. Fact Sheet for Patients: https://www.moore.com/ Fact Sheet for Healthcare Providers: https://www.young.biz/ This test is not yet approved or cleared by the [place] FDA and  has been authorized for detection and/or diagnosis of SARS-CoV-2 by  FDA under an Emergency Use Authorization (EUA). This EUA will remain  in effect (meaning this test can be used) for the duration of the  Covid-19 declaration under Section 564(b)(1) of the Act, 21  U.S.C. section 360bbb-3(b)(1), unless the authorization is  terminated or revoked. Performed at [facility], [address] [place], [place] [address]   . WBC 05/27/2019 6.8  4.0 - 10.5 K/uL Final  . RBC 05/27/2019 4.92  4.22 - 5.81 MIL/uL Final  . Hemoglobin 05/27/2019 14.4  13.0 - 17.0 g/dL Final  . HCT 43/32/9518 43.7  39 - 52 % Final  . MCV 05/27/2019 88.8  80.0 - 100.0 fL Final  . MCH 05/27/2019 29.3  26.0 - 34.0 pg Final  . MCHC 05/27/2019 33.0  30.0 - 36.0 g/dL Final  . RDW 84/16/6063 11.6  11.5 - 15.5 % Final  . Platelets 05/27/2019 283  150 - 400 K/uL Final  . nRBC 05/27/2019 0.0  0.0 - 0.2 % Final   Performed at [facility], [address] [place], [place] [address]  . Hgb A1c MFr Bld 05/27/2019 4.9  4.8 - 5.6 % Final   Comment: (NOTE) Pre diabetes:          5.7%-6.4% Diabetes:              >6.4% Glycemic control for   <7.0% adults with diabetes   . Mean Plasma Glucose 05/27/2019 93.93  mg/dL Final   Performed at [facility], [address], [place], [place] [address]  . Alcohol, Ethyl (B) 05/27/2019 <10  <10 mg/dL Final   Comment: (NOTE) Lowest detectable limit for serum alcohol is 10 mg/dL. For medical purposes only. Performed at [facility], [address], [place], [place] [address]   . Cholesterol 05/27/2019 228* 0 - 200 mg/dL Final  . Triglycerides 05/27/2019 411* <150 mg/dL Final  .  HDL 05/27/2019 56  >40 mg/dL Final  . Total CHOL/HDL Ratio 05/27/2019 4.1  RATIO Final  . VLDL 05/27/2019 UNABLE TO CALCULATE IF TRIGLYCERIDE OVER 400 mg/dL  0 - 40 mg/dL Final  . LDL Cholesterol 05/27/2019 UNABLE TO CALCULATE IF TRIGLYCERIDE OVER 400 mg/dL  0 - 99 mg/dL Final   Comment:        Total Cholesterol/HDL:CHD Risk Coronary Heart Disease Risk Table                     Men   Women  1/2 Average Risk   3.4   3.3  Average Risk       5.0   4.4  2 X Average Risk   9.6   7.1  3 X Average Risk  23.4   11.0        Use the calculated Patient Ratio above and the CHD Risk Table to determine the patient's CHD Risk.        ATP III CLASSIFICATION (LDL):  <100     mg/dL   Optimal  197-588  mg/dL   Near or Above                    Optimal  130-159  mg/dL   Borderline  325-498  mg/dL   High  >264     mg/dL   Very High Performed at [facility], [address], [place], [place] [address]   . TSH 05/27/2019 2.742  0.350 - 4.500 uIU/mL Final   Comment: Performed by a 3rd Generation assay with a functional sensitivity of <=0.01 uIU/mL. Performed at [facility], [address], [place], [place] [address]   . Color, Urine 05/27/2019 YELLOW  YELLOW Final  . APPearance 05/27/2019 CLOUDY* CLEAR Final  . Specific Gravity, Urine 05/27/2019 1.015  1.005 - 1.030 Final  . pH 05/27/2019 7.0  5.0 - 8.0 Final  . Glucose, UA 05/27/2019 NEGATIVE  NEGATIVE mg/dL Final  . Hgb urine dipstick 05/27/2019 NEGATIVE  NEGATIVE Final  . Bilirubin Urine 05/27/2019 NEGATIVE  NEGATIVE Final  . Ketones, ur 05/27/2019 NEGATIVE  NEGATIVE mg/dL Final  . Protein, ur 80/88/1103 NEGATIVE  NEGATIVE mg/dL Final  . Nitrite 15/94/5859 NEGATIVE  NEGATIVE Final  . Leukocytes,Ua 05/27/2019 LARGE* NEGATIVE Final  . RBC / HPF 05/27/2019 0-5  0 -  5 RBC/hpf Final  . WBC, UA 05/27/2019 21-50  0 - 5 WBC/hpf Final  . Bacteria, UA 05/27/2019 RARE* NONE SEEN Final  . Mucus 05/27/2019 PRESENT   Final  . Amorphous Crystal 05/27/2019 PRESENT   Final   Performed at [facility], [address], [place], [place] [address]  . Opiates 05/27/2019 NONE DETECTED  NONE DETECTED Final  . Cocaine 05/27/2019 NONE DETECTED  NONE DETECTED Final  . Benzodiazepines 05/27/2019 NONE DETECTED  NONE DETECTED Final  . Amphetamines 05/27/2019 NONE DETECTED  NONE DETECTED Final  . Tetrahydrocannabinol 05/27/2019 NONE DETECTED  NONE DETECTED Final  . Barbiturates 05/27/2019 NONE DETECTED  NONE DETECTED Final   Comment: (NOTE) DRUG SCREEN FOR MEDICAL PURPOSES ONLY.  IF CONFIRMATION IS NEEDED FOR ANY PURPOSE, NOTIFY LAB WITHIN 5 DAYS. LOWEST DETECTABLE LIMITS FOR URINE DRUG SCREEN Drug Class                     Cutoff (ng/mL) Amphetamine and metabolites    1000 Barbiturate and metabolites    200 Benzodiazepine  200 Tricyclics and metabolites     300 Opiates and metabolites        300 Cocaine and metabolites        300 THC                            50 Performed at [facility], [address] [place], [place] [address]   . Direct LDL 05/27/2019 125.8* 0 - 99 mg/dL Final   Performed at [facility], [address] [place], [place] [address]    Allergies: Patient has no known allergies.  PTA Medications: (Not in a hospital admission)   Medical Decision Making  Disposition: Recommend psychiatric Inpatient admission when medically cleared. Supportive therapy provided about ongoing stressors.    Recommendations  Based on my evaluation the patient does not appear to have an emergency medical condition.  Wandra Arthurs, NP 12/19/19  1:13 AM

## 2019-12-19 NOTE — ED Provider Notes (Signed)
FBC/OBS ASAP Discharge Summary  Date and Time: 12/19/2019 10:20 AM  Name: Walter Howe  MRN:  [id number]   Discharge Diagnoses:  Final diagnoses:  Schizoaffective disorder, bipolar type [facility])    Subjective: Patient reports today that he is feeling better.  He states that he slept well and he denies any suicidal or homicidal ideations and denies any hallucinations.  Patient reports that yesterday everything that was going on and seemed extremely real and felt that he needed to be in the hospital.  He states he is unsure why things seem to gotten worse yesterday.  Patient was informed that he was not tested positive for amphetamines in his UDS denies multiple times that he does not use any amphetamines.  However, when patient provided consent to contact his father for safety plan he requested that I did not discuss him being positive for amphetamines in his UDS.  Patient reports that he has been taking his medications as prescribed and has been going to his follow-up appointments with the [facility] behavioral health center. Patient's father, Huntington Leverich, was contacted for collateral.  Patient's father states that he feels safe with the patient coming back home.  He states that last night was pretty rough because the patient was becoming more paranoid about things going on outside.  He reports that he is the only 1 that helps look after him and that he has no other family or friends that are willing to stay with him.  He states that for a couple weeks at a time he has to work night shift and that is when things become worse for the patient.  He does report that he is unable to come pick him up today and that the patient will need transportation home.  He states that there are no concerns with him returning home.  Stay Summary: Patient presented to the [facility] via EMS.  Patient has a history of schizophrenia, PTSD, and MDD.  Patient had presented to be anxious and disoriented and appeared to be  thought blocking with tangential speech and disorganized thought process.  Patient was given Zyprexa last night and slept all the rest of the night.  This morning the patient has woken and was restarted on his home medications.  Patient's potassium level was 3.0 and it was given a one-time dose of Klor-Con 40 mEq.  Patient's UDS was done this morning after he woke up and he was positive for amphetamines however patient has denied using any amphetamines.  Patient was much more calm, cooperative, and pleasant today.  Patient was alert and oriented and did not seem to have any thought blocking or disorganized thought process.  Patient did speak to his father earlier today.  I spoke with patient's father and establish safety plan.  There were no concerns with the patient at home today.  Patient denied any suicidal or homicidal ideations and denied any current hallucinations and felt safe discharging home with his father.  Patient does not meet inpatient psychiatric treatment criteria and is psychiatric cleared.  Total Time spent with patient: 20 minutes  Past Psychiatric History:  Schizophrenia disorder, PTSD and MDD Past Medical History:  Past Medical History:  Diagnosis Date  . Anxiety    03/05/2019- in the past, not current  . History of kidney stones    passed    Past Surgical History:  Procedure Laterality Date  . NO PAST SURGERIES    . ORIF HUMERUS FRACTURE Right 03/07/2019   Procedure: OPEN  REDUCTION INTERNAL FIXATION (ORIF) RIGHT HUMERUS;  Surgeon: Tarry Kos, MD;  Location: [facility] OR;  Service: Orthopedics;  Laterality: Right;   Family History: History reviewed. No pertinent family history. Family Psychiatric History: None reported Social History:  Social History   Substance and Sexual Activity  Alcohol Use Yes   Comment: ocassional     Social History   Substance and Sexual Activity  Drug Use Yes   Comment: vicodin    Social History   Socioeconomic History  . Marital status:  Single    Spouse name: Not on file  . Number of children: Not on file  . Years of education: Not on file  . Highest education level: Not on file  Occupational History  . Not on file  Tobacco Use  . Smoking status: Former Smoker    Quit date: 02/20/2019    Years since quitting: 0.8  . Smokeless tobacco: Never Used  Vaping Use  . Vaping Use: Every day  . Devices: used i n attempt to stop smoking- did n ot work , quit vaping- 2019  Substance and Sexual Activity  . Alcohol use: Yes    Comment: ocassional  . Drug use: Yes    Comment: vicodin  . Sexual activity: Not on file  Other Topics Concern  . Not on file  Social History Narrative  . Not on file   Social Determinants of Health   Financial Resource Strain:   . Difficulty of Paying Living Expenses:   Food Insecurity:   . Worried About Programme researcher, broadcasting/film/video in the Last Year:   . Barista in the Last Year:   Transportation Needs:   . Freight forwarder (Medical):   Marland Kitchen Lack of Transportation (Non-Medical):   Physical Activity:   . Days of Exercise per Week:   . Minutes of Exercise per Session:   Stress:   . Feeling of Stress :   Social Connections:   . Frequency of Communication with Friends and Family:   . Frequency of Social Gatherings with Friends and Family:   . Attends Religious Services:   . Active Member of Clubs or Organizations:   . Attends Banker Meetings:   Marland Kitchen Marital Status:    SDOH:  SDOH Screenings   Alcohol Screen: Low Risk   . Last Alcohol Screening Score (AUDIT): 2  Depression (PHQ2-9):   . PHQ-2 Score:   Financial Resource Strain:   . Difficulty of Paying Living Expenses:   Food Insecurity:   . Worried About Programme researcher, broadcasting/film/video in the Last Year:   . [employer] of Food in the Last Year:   Housing:   . Last Housing Risk Score:   Physical Activity:   . Days of Exercise per Week:   . Minutes of Exercise per Session:   Social Connections:   . Frequency of Communication with  Friends and Family:   . Frequency of Social Gatherings with Friends and Family:   . Attends Religious Services:   . Active Member of Clubs or Organizations:   . Attends Banker Meetings:   Marland Kitchen Marital Status:   Stress:   . Feeling of Stress :   Tobacco Use: Medium Risk  . Smoking Tobacco Use: Former Smoker  . Smokeless Tobacco Use: Never Used  Transportation Needs:   . Freight forwarder (Medical):   Marland Kitchen Lack of Transportation (Non-Medical):     Has this patient used any form of tobacco in the  last 30 days? (Cigarettes, Smokeless Tobacco, Cigars, and/or Pipes) A prescription for an FDA-approved tobacco cessation medication was offered at discharge and the patient refused  Current Medications:  Current Facility-Administered Medications  Medication Dose Route Frequency Provider Last Rate Last Admin  . acetaminophen (TYLENOL) tablet 650 mg  650 mg Oral Q6H PRN Anike, Adaku C, NP      . busPIRone (BUSPAR) tablet 5 mg  5 mg Oral TID Alysandra Lobue, Gerlene Burdock, FNP   5 mg at 12/19/19 1012  . divalproex (DEPAKOTE) DR tablet 250 mg  250 mg Oral Q8H Beonca Gibb B, FNP   250 mg at 12/19/19 0755  . FLUoxetine (PROZAC) capsule 20 mg  20 mg Oral Daily Terrill Wauters, Gerlene Burdock, FNP   20 mg at 12/19/19 1013  . hydrOXYzine (ATARAX/VISTARIL) tablet 25 mg  25 mg Oral TID PRN Heraclio Seidman, Gerlene Burdock, FNP      . traZODone (DESYREL) tablet 50 mg  50 mg Oral QHS PRN Desaree Downen, Gerlene Burdock, FNP       Current Outpatient Medications  Medication Sig Dispense Refill  . busPIRone (BUSPAR) 5 MG tablet Take 5 mg by mouth 3 (three) times daily.    . divalproex (DEPAKOTE) 250 MG DR tablet Take 250 mg by mouth 3 (three) times daily.    Marland Kitchen FLUoxetine (PROZAC) 20 MG capsule Take 1 capsule (20 mg total) by mouth daily. For depression (Patient taking differently: Take 20 mg by mouth 2 (two) times daily. For depression) 30 capsule 0  . hydrOXYzine (ATARAX/VISTARIL) 25 MG tablet Take 1 tablet (25 mg total) by mouth 3 (three) times daily as  needed for anxiety. (Patient not taking: Reported on 12/19/2019) 75 tablet 0  . nicotine polacrilex (NICORETTE) 2 MG gum Take 1 each (2 mg total) by mouth as needed. (May buy from over the counter): For smoking cessation (Patient not taking: Reported on 12/19/2019) 100 tablet 0  . OLANZapine (ZYPREXA) 20 MG tablet Take 20 mg by mouth at bedtime.    Marland Kitchen omega-3 acid ethyl esters (LOVAZA) 1 g capsule Take 1 capsule (1 g total) by mouth 2 (two) times daily. For high cholesterol (Patient not taking: Reported on 12/19/2019) 30 capsule 0  . Prenatal Vit-Fe Fumarate-FA (PRENATAL MULTIVITAMIN) TABS tablet Take 1 tablet by mouth daily at 12 noon. (May buy from over the counter): Vitamin supplementation (Patient not taking: Reported on 12/19/2019)    . traZODone (DESYREL) 50 MG tablet Take 1 tablet (50 mg total) by mouth at bedtime as needed for sleep. (Patient not taking: Reported on 12/19/2019) 30 tablet 0    PTA Medications: (Not in a hospital admission)   Musculoskeletal  Strength & Muscle Tone: within normal limits Gait & Station: normal Patient leans: N/A  Psychiatric Specialty Exam  Presentation  General Appearance: Casual  Eye Contact:Good  Speech:Clear and Coherent;Normal Rate  Speech Volume:Normal  Handedness:Right   Mood and Affect  Mood:Euthymic  Affect:Congruent   Thought Process  Thought Processes:Coherent  Descriptions of Associations:Intact  Orientation:Full (Time, Place and Person)  Thought Content:WDL  Hallucinations:Hallucinations: None  Ideas of Reference:None  Suicidal Thoughts:Suicidal Thoughts: No  Homicidal Thoughts:Homicidal Thoughts: No   Sensorium  Memory:Immediate Good;Recent Good;Remote Good  Judgment:Fair  Insight:Fair   Executive Functions  Concentration:Good  Attention Span:Good  Recall:Fair  Fund of Knowledge:Fair  Language:Good   Psychomotor Activity  Psychomotor Activity:Psychomotor Activity: Normal   Assets   Assets:Communication Skills;Desire for Improvement;Housing;Social Support;Physical Health;Transportation   Sleep  Sleep:Sleep: Good   Physical Exam  Physical Exam ROS  Blood pressure (!) 98/56, pulse 97, temperature 97.6 F (36.4 C), temperature source Oral, resp. rate 16, SpO2 100 %. There is no height or weight on file to calculate BMI.  Demographic Factors:  Male and Caucasian  Loss Factors: NA  Historical Factors: NA  Risk Reduction Factors:   Living with another person, especially a relative, Positive social support, Positive therapeutic relationship and Positive coping skills or problem solving skills  Continued Clinical Symptoms:  Previous Psychiatric Diagnoses and Treatments  Cognitive Features That Contribute To Risk:  None    Suicide Risk:  Minimal: No identifiable suicidal ideation.  Patients presenting with no risk factors but with morbid ruminations; may be classified as minimal risk based on the severity of the depressive symptoms  Plan Of Care/Follow-up recommendations:  Continue activity as tolerated. Continue diet as recommended by your PCP. Ensure to keep all appointments with outpatient providers.  Disposition: Discharge home to father. Transportation provided by safe transport  [employer] Group[employer]is B Clotile Whittington, FNP 12/19/2019, 10:20 AM

## 2019-12-19 NOTE — Discharge Instructions (Addendum)
Continue current medications as prescribed Keep scheduled appointments with outpatient providers

## 2019-12-19 NOTE — ED Notes (Signed)
Pt took meds PO without complaint.

## 2019-12-19 NOTE — ED Notes (Signed)
Pt awake, alert & responsive, presents with anxiety and paranoia, pt very restless.  Pt to facility via EMS.  Denies SI, HI.  Pt reports while at home he saw lights under his window and heard footsteps.  SKin search completed, monitoring for safety. Pt cooperative.

## 2020-02-14 ENCOUNTER — Telehealth (HOSPITAL_COMMUNITY): Payer: No Payment, Other

## 2020-02-14 ENCOUNTER — Other Ambulatory Visit: Payer: Self-pay

## 2020-03-17 ENCOUNTER — Other Ambulatory Visit: Payer: Self-pay

## 2020-03-17 ENCOUNTER — Telehealth (HOSPITAL_COMMUNITY): Payer: No Payment, Other

## 2020-03-30 ENCOUNTER — Other Ambulatory Visit: Payer: Self-pay | Admitting: Psychiatry

## 2020-04-14 ENCOUNTER — Telehealth (HOSPITAL_COMMUNITY): Payer: No Payment, Other

## 2020-04-30 ENCOUNTER — Other Ambulatory Visit: Payer: Self-pay

## 2020-04-30 ENCOUNTER — Telehealth (INDEPENDENT_AMBULATORY_CARE_PROVIDER_SITE_OTHER): Payer: No Payment, Other | Admitting: Physician Assistant

## 2020-04-30 DIAGNOSIS — F4310 Post-traumatic stress disorder, unspecified: Secondary | ICD-10-CM | POA: Diagnosis not present

## 2020-04-30 DIAGNOSIS — F250 Schizoaffective disorder, bipolar type: Secondary | ICD-10-CM

## 2020-04-30 DIAGNOSIS — F331 Major depressive disorder, recurrent, moderate: Secondary | ICD-10-CM | POA: Diagnosis not present

## 2020-04-30 DIAGNOSIS — G4700 Insomnia, unspecified: Secondary | ICD-10-CM

## 2020-04-30 DIAGNOSIS — F411 Generalized anxiety disorder: Secondary | ICD-10-CM

## 2020-04-30 MED ORDER — TRAZODONE HCL 50 MG PO TABS: 50.0000 mg | ORAL_TABLET | Freq: Every evening | ORAL | 2 refills | Status: DC | PRN

## 2020-04-30 MED ORDER — OLANZAPINE 20 MG PO TABS: 20.0000 mg | ORAL_TABLET | Freq: Every day | ORAL | 2 refills | Status: DC

## 2020-04-30 MED ORDER — FLUOXETINE HCL 40 MG PO CAPS: 40.0000 mg | ORAL_CAPSULE | Freq: Every day | ORAL | 2 refills | Status: DC

## 2020-04-30 MED ORDER — DIVALPROEX SODIUM 250 MG PO DR TAB: 250.0000 mg | DELAYED_RELEASE_TABLET | Freq: Three times a day (TID) | ORAL | 2 refills | Status: DC

## 2020-04-30 MED ORDER — BUSPIRONE HCL 5 MG PO TABS: 5.0000 mg | ORAL_TABLET | Freq: Three times a day (TID) | ORAL | 2 refills | Status: DC

## 2020-04-30 NOTE — Progress Notes (Signed)
BH MD/PA/NP OP Progress Note  Virtual Visit via Telephone Note  I connected with Walter Howe on 04/30/2020 at  4:00 PM EST by telephone and verified that I am speaking with the correct person using two identifiers.  Location: Patient: Home Provider: Clinic   I discussed the limitations, risks, security and privacy concerns of performing an evaluation and management service by telephone and the availability of in person appointments. I also discussed with the patient that there may be a patient responsible charge related to this service. The patient expressed understanding and agreed to proceed.  Follow Up Instructions:  I discussed the assessment and treatment plan with the patient. The patient was provided an opportunity to ask questions and all were answered. The patient agreed with the plan and demonstrated an understanding of the instructions.   The patient was advised to call back or seek an in-person evaluation if the symptoms worsen or if the condition fails to improve as anticipated.  I provided 35 minutes of non-face-to-face time during this encounter.  Meta Hatchet, PA   04/30/2020 4:58 PM Walter Howe  MRN:  [id number]  Chief Complaint:  Chief Complaint    Medication Refill; Follow-up     HPI:   Walter Howe is a 34 year old male with a past psychiatric history significant for PTSD, bipolar disorder, major depressive disorder, generalized anxiety disorder, and insomnia who presents to [facility] behavioral health outpatient clinic via virtual telephone visit for follow-up and medication management.  Patient is currently being managed on the following medications:  Buspirone 5 mg 3 times daily Fluoxetine 40 mg by mouth daily Olanzapine 20 mg at bedtime Depakote 250 mg 3 times daily Trazodone 50 mg at bedtime as needed  Patient reports that he is currently out of all his medications and is requesting refills.  Patient reports that this current regimen  has been managing his symptoms well, however, patient reports a couple of psychotic episodes consisting of odd behavior and disorientation.  During one of these episodes, patient reports leaving his house and standing in his yard for 3-1/2 hours because he thought someone was in his house.  Patient further explains that he feels like he is going to get shot or someone is going to break into his house.  Patient states that he was on current regimen of medications at the time but endorses missing one day.  Today, patient reports that things have been going well, for the most part.  He still has concerns over his elevated anxiety and wishes that he could have a normal day.  Patient reports that he not able to attribute any stressors to his anxiety.  He further endorses lack of motivation and sleeping way too much.  Patient denies active suicidal or homicidal ideations.  He denies current auditory or visual hallucinations, however, he does report that he thought people were trying to get into his house and thought someone was trying to hit him.  Patient endorses too much sleep and states that he sleeps from 8 PM to 12 PM.  Patient reports having unhealthy eating habits before he was placed on his medications.  He reports having a fair appetite now.  Patient denies alcohol consumption and illicit drug use.  Patient endorses tobacco use and smokes a cigarette roughly every 3 days.  Patient recently has started actively vaping.    Visit Diagnosis:    ICD-10-CM   1. Schizoaffective disorder, bipolar type (HCC)  F25.0 OLANZapine (ZYPREXA) 20 MG tablet  divalproex (DEPAKOTE) 250 MG DR tablet  2. Posttraumatic stress disorder  F43.10 busPIRone (BUSPAR) 5 MG tablet  3. Moderate episode of recurrent major depressive disorder (HCC)  F33.1 busPIRone (BUSPAR) 5 MG tablet    FLUoxetine (PROZAC) 40 MG capsule    traZODone (DESYREL) 50 MG tablet  4. Generalized anxiety disorder  F41.1 busPIRone (BUSPAR) 5 MG tablet     traZODone (DESYREL) 50 MG tablet  5. Insomnia, unspecified type  G47.00 traZODone (DESYREL) 50 MG tablet    Past Psychiatric History: Schizoaffective disorder, bipolar type PTSD Major depressive disorder Generalized anxiety disorder Insomnia  Past Medical History:  Past Medical History:  Diagnosis Date  . Anxiety    03/05/2019- in the past, not current  . History of kidney stones    passed    Past Surgical History:  Procedure Laterality Date  . NO PAST SURGERIES    . ORIF HUMERUS FRACTURE Right 03/07/2019   Procedure: OPEN REDUCTION INTERNAL FIXATION (ORIF) RIGHT HUMERUS;  Surgeon: Tarry Kos, MD;  Location: [facility] OR;  Service: Orthopedics;  Laterality: Right;    Family Psychiatric History: Unknown  Family History: No family history on file.  Social History:  Social History   Socioeconomic History  . Marital status: Single    Spouse name: Not on file  . Number of children: Not on file  . Years of education: Not on file  . Highest education level: Not on file  Occupational History  . Not on file  Tobacco Use  . Smoking status: Former Smoker    Quit date: 02/20/2019    Years since quitting: 1.1  . Smokeless tobacco: Never Used  Vaping Use  . Vaping Use: Every day  . Devices: used i n attempt to stop smoking- did n ot work , quit vaping- 2019  Substance and Sexual Activity  . Alcohol use: Yes    Comment: ocassional  . Drug use: Yes    Comment: vicodin  . Sexual activity: Not on file  Other Topics Concern  . Not on file  Social History Narrative  . Not on file   Social Determinants of Health   Financial Resource Strain: Not on file  Food Insecurity: Not on file  Transportation Needs: Not on file  Physical Activity: Not on file  Stress: Not on file  Social Connections: Not on file    Allergies: No Known Allergies  Metabolic Disorder Labs: Lab Results  Component Value Date   HGBA1C 4.7 (L) 12/19/2019   MPG 88.19 12/19/2019   MPG 93.93  05/27/2019   No results found for: PROLACTIN Lab Results  Component Value Date   CHOL 241 (H) 12/19/2019   TRIG 107 12/19/2019   HDL 74 12/19/2019   CHOLHDL 3.3 12/19/2019   VLDL 21 12/19/2019   LDLCALC 146 (H) 12/19/2019   LDLCALC UNABLE TO CALCULATE IF TRIGLYCERIDE OVER 400 mg/dL 10/93/2355   Lab Results  Component Value Date   TSH 4.728 (H) 12/19/2019   TSH 2.742 05/27/2019    Therapeutic Level Labs: No results found for: LITHIUM Lab Results  Component Value Date   VALPROATE <10 (L) 12/19/2019   No components found for:  CBMZ  Current Medications: Current Outpatient Medications  Medication Sig Dispense Refill  . busPIRone (BUSPAR) 5 MG tablet Take 1 tablet (5 mg total) by mouth 3 (three) times daily. 90 tablet 2  . divalproex (DEPAKOTE) 250 MG DR tablet Take 1 tablet (250 mg total) by mouth 3 (three) times daily. 90 tablet 2  .  FLUoxetine (PROZAC) 40 MG capsule Take 1 capsule (40 mg total) by mouth daily. For depression 30 capsule 2  . hydrOXYzine (ATARAX/VISTARIL) 25 MG tablet Take 1 tablet (25 mg total) by mouth 3 (three) times daily as needed for anxiety. (Patient not taking: Reported on 12/19/2019) 75 tablet 0  . nicotine polacrilex (NICORETTE) 2 MG gum Take 1 each (2 mg total) by mouth as needed. (May buy from over the counter): For smoking cessation (Patient not taking: Reported on 12/19/2019) 100 tablet 0  . OLANZapine (ZYPREXA) 20 MG tablet Take 1 tablet (20 mg total) by mouth at bedtime. 30 tablet 2  . omega-3 acid ethyl esters (LOVAZA) 1 g capsule Take 1 capsule (1 g total) by mouth 2 (two) times daily. For high cholesterol (Patient not taking: Reported on 12/19/2019) 30 capsule 0  . Prenatal Vit-Fe Fumarate-FA (PRENATAL MULTIVITAMIN) TABS tablet Take 1 tablet by mouth daily at 12 noon. (May buy from over the counter): Vitamin supplementation (Patient not taking: Reported on 12/19/2019)    . traZODone (DESYREL) 50 MG tablet Take 1 tablet (50 mg total) by mouth at  bedtime as needed for sleep. 30 tablet 2   No current facility-administered medications for this visit.     Musculoskeletal: Strength & Muscle Tone: Unable to assess due to telemedicine visit Gait & Station: Unable to assess due to telemedicine visit Patient leans: Unable to assess due to telemedicine visit  Psychiatric Specialty Exam: Review of Systems  Psychiatric/Behavioral: Positive for hallucinations (Patient reports that he thought people were trying to get in the house) and sleep disturbance. Negative for self-injury and suicidal ideas. The patient is nervous/anxious. The patient is not hyperactive.     There were no vitals taken for this visit.There is no height or weight on file to calculate BMI.  General Appearance: Unable to assess due to telemedicine visit  Eye Contact:  Unable to assess due to telemedicine visit  Speech:  Clear and Coherent and Normal Rate  Volume:  Normal  Mood:  Anxious, Depressed and Irritable  Affect:  Congruent and Depressed  Thought Process:  Coherent, Goal Directed and Descriptions of Associations: Intact  Orientation:  Full (Time, Place, and Person)  Thought Content: WDL, Logical and Hallucinations: Visual   Suicidal Thoughts:  No  Homicidal Thoughts:  No  Memory:  Immediate;   Good Recent;   Good Remote;   Good  Judgement:  Good  Insight:  Fair  Psychomotor Activity:  Restlessness  Concentration:  Concentration: Good and Attention Span: Good  Recall:  Good  Fund of Knowledge: Good  Language: Good  Akathisia:  NA  Handed:  Right  AIMS (if indicated): not done  Assets:  Communication Skills Desire for Improvement Housing  ADL's:  Intact  Cognition: WNL  Sleep:  Fair   Screenings: AIMS   Flowsheet Row Ad[facility] (Discharged) [facility]  AIMS Total Score 0    AUDIT   Flowsheet Row Admission (Discharged) [facility]  Alcohol Use Disorder Identification Test Final Score (AUDIT) 2       Assessment and Plan:  Walter Howe is a 34 year old male with a past psychiatric history significant for PTSD, bipolar disorder, major depressive disorder, generalized anxiety disorder, and insomnia [facility] telephone visit for follow-up and medication management.  Patient is currently being managed on the following  medications:  Buspirone 5 mg 3 times daily Fluoxetine 40 mg by mouth daily Olanzapine 20 mg at bedtime Depakote 250 mg 3 times daily Trazodone 50 mg at bedtime as needed  Patient endorses the following symptoms: worsening anxiety, lack of motivation, and hypersomnia. Patient denies current auditory or visual hallucinations, however, he reports having an episode where he thought people were trying to get into his house and come after him.  Patient is currently out of his medications and is requesting refills.  Patient's medications will be e-prescribed to pharmacy of choice.  1. Posttraumatic stress disorder  - busPIRone (BUSPAR) 5 MG tablet; Take 1 tablet (5 mg total) by mouth 3 (three) times daily.  Dispense: 90 tablet; Refill: 2  2. Schizoaffective disorder, bipolar type (HCC)  - OLANZapine (ZYPREXA) 20 MG tablet; Take 1 tablet (20 mg total) by mouth at bedtime.  Dispense: 30 tablet; Refill: 2 - divalproex (DEPAKOTE) 250 MG DR tablet; Take 1 tablet (250 mg total) by mouth 3 (three) times daily.  Dispense: 90 tablet; Refill: 2  3. Moderate episode of recurrent major depressive disorder (HCC)  - busPIRone (BUSPAR) 5 MG tablet; Take 1 tablet (5 mg total) by mouth 3 (three) times daily.  Dispense: 90 tablet; Refill: 2 - FLUoxetine (PROZAC) 40 MG capsule; Take 1 capsule (40 mg total) by mouth daily. For depression  Dispense: 30 capsule; Refill: 2 - traZODone (DESYREL) 50 MG tablet; Take 1 tablet (50 mg total) by  mouth at bedtime as needed for sleep.  Dispense: 30 tablet; Refill: 2  4. Generalized anxiety disorder  - busPIRone (BUSPAR) 5 MG tablet; Take 1 tablet (5 mg total) by mouth 3 (three) times daily.  Dispense: 90 tablet; Refill: 2 - traZODone (DESYREL) 50 MG tablet; Take 1 tablet (50 mg total) by mouth at bedtime as needed for sleep.  Dispense: 30 tablet; Refill: 2  5. Insomnia, unspecified type  - traZODone (DESYREL) 50 MG tablet; Take 1 tablet (50 mg total) by mouth at bedtime as needed for sleep.  Dispense: 30 tablet; Refill: 2  Patient to follow-up in 6 weeks.  Meta Hatchet, PA 04/30/2020, 4:58 PM

## 2020-05-18 IMAGING — RF DG C-ARM 1-60 MIN
1 series · 2 of 2 positions shown · non-contrast
Comparison: 02/28/2019

CLINICAL DATA: ORIF of a right humerus fracture. [HOSPITAL] Q0-QM.
Fluoro time: 2minutes, 15seconds.

EXAM:
RIGHT ELBOW - 2 VIEW; DG C-ARM 1-60 MIN

[Series 1: unknown protocol · 0.20mm/px · 2 of 2 slices shown]
[im 1/2]
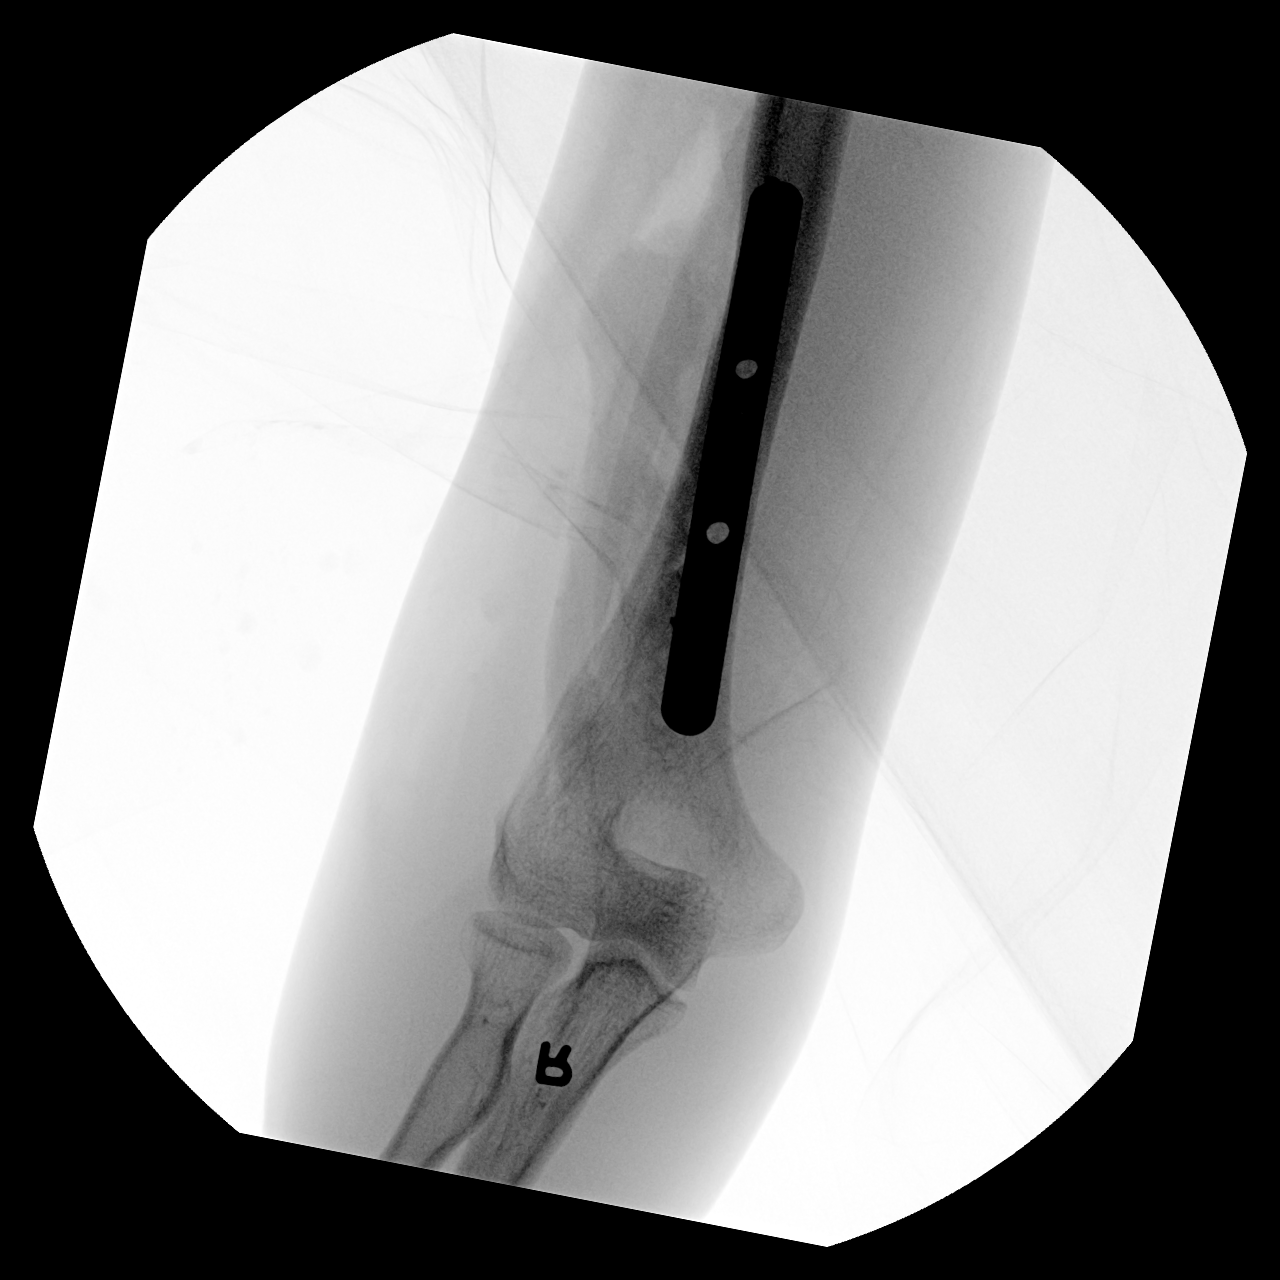
[im 2/2]
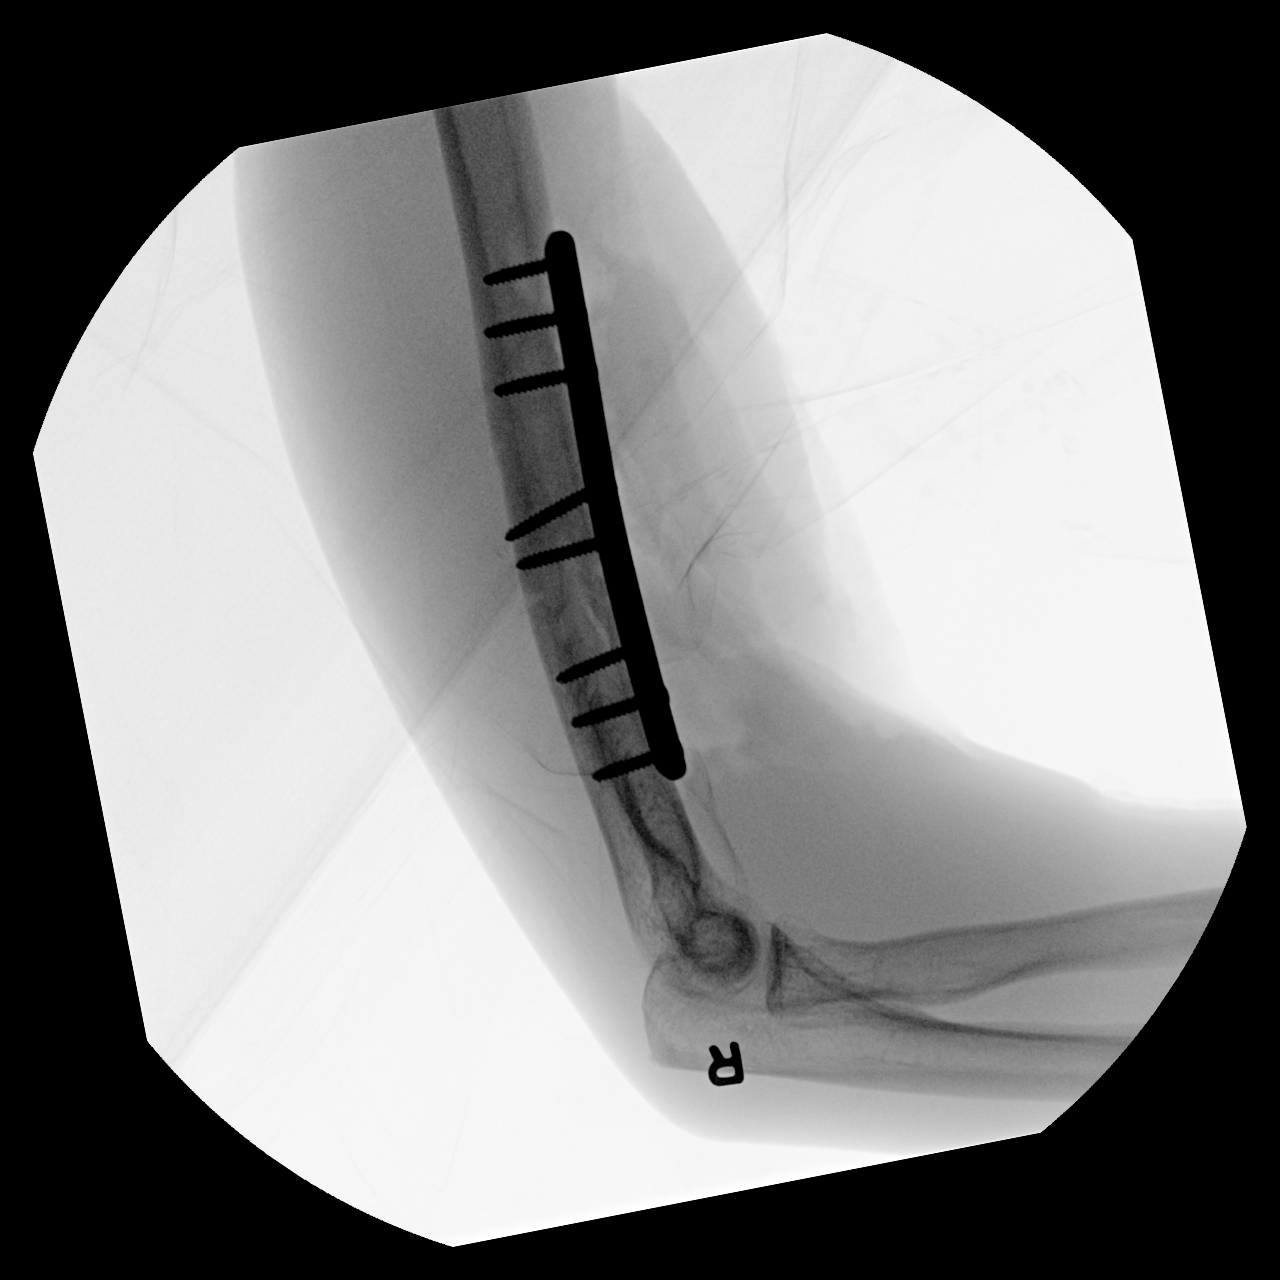

[2 of 2 positions shown; findings below may reference images not displayed]

FINDINGS: Status post screw plate fixation of the fracture of the distal RIGHT
humerus. Alignment is near anatomic. Soft tissue gas present.
IMPRESSION: Status post ORIF RIGHT humerus.

## 2020-06-10 ENCOUNTER — Other Ambulatory Visit: Payer: Self-pay

## 2020-06-10 ENCOUNTER — Encounter (HOSPITAL_COMMUNITY): Payer: Self-pay | Admitting: Physician Assistant

## 2020-06-10 ENCOUNTER — Telehealth (INDEPENDENT_AMBULATORY_CARE_PROVIDER_SITE_OTHER): Payer: No Payment, Other | Admitting: Physician Assistant

## 2020-06-10 DIAGNOSIS — F411 Generalized anxiety disorder: Secondary | ICD-10-CM

## 2020-06-10 DIAGNOSIS — F331 Major depressive disorder, recurrent, moderate: Secondary | ICD-10-CM

## 2020-06-10 DIAGNOSIS — F250 Schizoaffective disorder, bipolar type: Secondary | ICD-10-CM | POA: Diagnosis not present

## 2020-06-10 DIAGNOSIS — F4310 Post-traumatic stress disorder, unspecified: Secondary | ICD-10-CM | POA: Diagnosis not present

## 2020-06-10 DIAGNOSIS — G4700 Insomnia, unspecified: Secondary | ICD-10-CM | POA: Diagnosis not present

## 2020-06-10 NOTE — Progress Notes (Signed)
BH MD/PA/NP OP Progress Note  Virtual Visit via Telephone Note  I connected with Walter Howe on 06/10/20 at  4:00 PM EST by telephone and verified that I am speaking with the correct person using two identifiers.  Location: Patient: Home Provider: Clinic   I discussed the limitations, risks, security and privacy concerns of performing an evaluation and management service by telephone and the availability of in person appointments. I also discussed with the patient that there may be a patient responsible charge related to this service. The patient expressed understanding and agreed to proceed.  Follow Up Instructions:    I discussed the assessment and treatment plan with the patient. The patient was provided an opportunity to ask questions and all were answered. The patient agreed with the plan and demonstrated an understanding of the instructions.   The patient was advised to call back or seek an in-person evaluation if the symptoms worsen or if the condition fails to improve as anticipated.  I provided 20 minutes of non-face-to-face time during this encounter.   Meta Hatchet, PA   06/10/2020 9:55 PM Walter Howe  MRN:  [id number]  Chief Complaint: Follow-up and medication management  HPI:   Walter Howe is a 35 year old male with a past psychiatric history significant for PTSD, bipolar disorder, generalized anxiety disorder, and insomnia who presents to [facility] via virtual telephone visit for follow-up and medication management.  Patient is currently being managed on the following medications:  Buspirone 5 mg 3 times daily Fluoxetine 40 mg daily Olanzapine 20 mg at bedtime Trazodone 50 mg at bedtime Depakote 250 mg 3 times daily  Patient states that he notices changes in his mood from time to time.  Patient states that he has been experiencing episodes of depression.  Patient states that he is also experiencing waking up  in the morning with panic attack.  Patient states that his panic attacks manifest the following symptoms: difficulty breathing, inability to think, and racing thoughts.  Patient reports that his anxiety is a 5 out of 10 throughout the day, however, he states that it is unbearable in the morning.  The only things that are able to alleviate his anxiety include forcing himself to get up and movement/activity.  Patient states that his mood has been angry lately because he does not feel like he is a part of anything and often feels lonely.  Patient denies suicidal and homicidal ideations.  He further denies auditory or visual hallucinations.  Patient endorses good sleep and receives on average 10 hours of sleep each day.  Patient endorses good appetite.  Patient denies alcohol consumption and illicit drug use.  Patient endorses vaping and uses tobacco products.  Visit Diagnosis:    ICD-10-CM   1. Insomnia, unspecified type  G47.00   2. Generalized anxiety disorder  F41.1   3. Moderate episode of recurrent major depressive disorder (HCC)  F33.1   4. Schizoaffective disorder, bipolar type (HCC)  F25.0   5. Posttraumatic stress disorder  F43.10     Past Psychiatric History:  Schizoaffective disorder, bipolar type PTSD Major depressive disorder Generalized anxiety disorder Insomnia  Past Medical History:  Past Medical History:  Diagnosis Date  . Anxiety    03/05/2019- in the past, not current  . History of kidney stones    passed    Past Surgical History:  Procedure Laterality Date  . NO PAST SURGERIES    . ORIF HUMERUS FRACTURE Right 03/07/2019  Procedure: OPEN REDUCTION INTERNAL FIXATION (ORIF) RIGHT HUMERUS;  Surgeon: Tarry KosXu, Naiping M, MD;  Location: [facility];  Service: Orthopedics;  Laterality: Right;    Family Psychiatric History:  Unknown  Family History: History reviewed. No pertinent family history.  Social History:  Social History   Socioeconomic History  . Marital status:  Single    Spouse name: Not on file  . Number of children: Not on file  . Years of education: Not on file  . Highest education level: Not on file  Occupational History  . Not on file  Tobacco Use  . Smoking status: Former Smoker    Quit date: 02/20/2019    Years since quitting: 1.3  . Smokeless tobacco: Never Used  Vaping Use  . Vaping Use: Every day  . Devices: used i n attempt to stop smoking- did n ot work , quit vaping- 2019  Substance and Sexual Activity  . Alcohol use: Yes    Comment: ocassional  . Drug use: Yes    Comment: vicodin  . Sexual activity: Not on file  Other Topics Concern  . Not on file  Social History Narrative  . Not on file   Social Determinants of Health   Financial Resource Strain: Not on file  Food Insecurity: Not on file  Transportation Needs: Not on file  Physical Activity: Not on file  Stress: Not on file  Social Connections: Not on file    Allergies: No Known Allergies  Metabolic Disorder Labs: Lab Results  Component Value Date   HGBA1C 4.7 (L) 12/19/2019   MPG 88.19 12/19/2019   MPG 93.93 05/27/2019   No results found for: PROLACTIN Lab Results  Component Value Date   CHOL 241 (H) 12/19/2019   TRIG 107 12/19/2019   HDL 74 12/19/2019   CHOLHDL 3.3 12/19/2019   VLDL 21 12/19/2019   LDLCALC 146 (H) 12/19/2019   LDLCALC UNABLE TO CALCULATE IF TRIGLYCERIDE OVER 400 mg/dL 21/30/865701/08/2019   Lab Results  Component Value Date   TSH 4.728 (H) 12/19/2019   TSH 2.742 05/27/2019    Therapeutic Level Labs: No results found for: LITHIUM Lab Results  Component Value Date   VALPROATE <10 (L) 12/19/2019   No components found for:  CBMZ  Current Medications: Current Outpatient Medications  Medication Sig Dispense Refill  . busPIRone (BUSPAR) 5 MG tablet Take 1 tablet (5 mg total) by mouth 3 (three) times daily. 90 tablet 2  . divalproex (DEPAKOTE) 250 MG DR tablet Take 1 tablet (250 mg total) by mouth 3 (three) times daily. 90 tablet 2  .  FLUoxetine (PROZAC) 40 MG capsule Take 1 capsule (40 mg total) by mouth daily. For depression 30 capsule 2  . hydrOXYzine (ATARAX/VISTARIL) 25 MG tablet Take 1 tablet (25 mg total) by mouth 3 (three) times daily as needed for anxiety. (Patient not taking: Reported on 12/19/2019) 75 tablet 0  . nicotine polacrilex (NICORETTE) 2 MG gum Take 1 each (2 mg total) by mouth as needed. (May buy from over the counter): For smoking cessation (Patient not taking: Reported on 12/19/2019) 100 tablet 0  . OLANZapine (ZYPREXA) 20 MG tablet Take 1 tablet (20 mg total) by mouth at bedtime. 30 tablet 2  . omega-3 acid ethyl esters (LOVAZA) 1 g capsule Take 1 capsule (1 g total) by mouth 2 (two) times daily. For high cholesterol (Patient not taking: Reported on 12/19/2019) 30 capsule 0  . Prenatal Vit-Fe Fumarate-FA (PRENATAL MULTIVITAMIN) TABS tablet Take 1 tablet by mouth daily  at 12 noon. (May buy from over the counter): Vitamin supplementation (Patient not taking: Reported on 12/19/2019)    . traZODone (DESYREL) 50 MG tablet Take 1 tablet (50 mg total) by mouth at bedtime as needed for sleep. 30 tablet 2   No current facility-administered medications for this visit.     Musculoskeletal: Strength & Muscle Tone: Unable to assess due to telemedicine visit Gait & Station: Unable to assess due to telemedicine visit Patient leans: Unable to assess due to telemedicine visit  Psychiatric Specialty Exam: Review of Systems  There were no vitals taken for this visit.There is no height or weight on file to calculate BMI.  General Appearance: Unable to assess due to telemedicine visit  Eye Contact:  Unable to assess due to telemedicine visit  Speech:  Clear and Coherent and Normal Rate  Volume:  Normal  Mood:  Anxious and Euthymic  Affect:  Appropriate and Congruent  Thought Process:  Coherent, Goal Directed and Descriptions of Associations: Intact  Orientation:  Full (Time, Place, and Person)  Thought Content: WDL and  Logical   Suicidal Thoughts:  No  Homicidal Thoughts:  No  Memory:  Immediate;   Good Recent;   Good Remote;   Good  Judgement:  Good  Insight:  Good  Psychomotor Activity:  Restlessness  Concentration:  Concentration: Good and Attention Span: Good  Recall:  Good  Fund of Knowledge: Good  Language: Good  Akathisia:  NA  Handed:  Right  AIMS (if indicated): not done  Assets:  Communication Skills Desire for Improvement Housing  ADL's:  Intact  Cognition: WNL  Sleep:  Good   Screenings: AIMS   Flowsheet Row [facility] (Dis[facility]  AIMS Total Score 0    AUDIT   Flowsheet Row Admission (Discharged) [facility]  Alcohol Use Disorder Identification Test Final Score (AUDIT) 2       Assessment and Plan:  Walter Howe is a 35 year old male with a past psychiatric history significant for PTSD, bipolar disorder, generalized anxiety disorder, and insomnia who presents to [facility] via virtual telephone visit for follow-up and medication management.  Patient notes that he has been experiencing instances of depressed mood lately.  In addition to his depressive symptoms, patient states that he has also been experiencing panic attacks in the morning and anxiety that lingers throughout the day.  Patient was recommended increasing his dosage of Prozac from 40 mg to 60 mg for the management of his depression, anxiety, and panic attacks.  Patient was also recommended increasing dosage of buspirone from 7.5 mg 3 times daily to 10 mg 3 times daily for the management of his anxiety.  Patient was agreeable to recommendation.  Patient's medications will be e-prescribed to pharmacy of choice.  1. Insomnia, unspecified type Patient to continue taking trazodone 50 mg at bedtime for the management of his insomnia  2.  Generalized anxiety disorder  - FLUoxetine (PROZAC) 20 MG capsule; Take 3 capsules (60 mg total) by mouth daily.  Dispense: 90 capsule; Refill: 1 - busPIRone (BUSPAR) 7.5 MG tablet; Take 1 tablet (7.5 mg total) by mouth 3 (three) times daily.  Dispense: 90 tablet; Refill: 1  3. Schizoaffective disorder, bipolar type [facility]) Patient to continue taking olanzapine 20 mg at bedtime for the management of his bipolar disorder Patient to continue taking divalproex 250 mg by  mouth 3 times daily for the management of his bipolar disorder  - FLUoxetine (PROZAC) 20 MG capsule; Take 3 capsules (60 mg total) by mouth daily.  Dispense: 90 capsule; Refill: 1  4. Posttraumatic stress disorder  - FLUoxetine (PROZAC) 20 MG capsule; Take 3 capsules (60 mg total) by mouth daily.  Dispense: 90 capsule; Refill: 1  Patient to follow-up in 6 weeks  Meta Hatchet, PA 06/10/2020, 9:55 PM

## 2020-06-24 MED ORDER — BUSPIRONE HCL 7.5 MG PO TABS: 7.5000 mg | ORAL_TABLET | Freq: Three times a day (TID) | ORAL | 1 refills | Status: DC

## 2020-06-24 MED ORDER — FLUOXETINE HCL 20 MG PO CAPS: 60.0000 mg | ORAL_CAPSULE | Freq: Every day | ORAL | 1 refills | Status: DC

## 2020-07-23 ENCOUNTER — Encounter (HOSPITAL_COMMUNITY): Payer: Self-pay | Admitting: Physician Assistant

## 2020-07-23 ENCOUNTER — Other Ambulatory Visit: Payer: Self-pay

## 2020-07-23 ENCOUNTER — Telehealth (INDEPENDENT_AMBULATORY_CARE_PROVIDER_SITE_OTHER): Payer: No Payment, Other | Admitting: Physician Assistant

## 2020-07-23 DIAGNOSIS — G4700 Insomnia, unspecified: Secondary | ICD-10-CM

## 2020-07-23 DIAGNOSIS — F411 Generalized anxiety disorder: Secondary | ICD-10-CM | POA: Diagnosis not present

## 2020-07-23 DIAGNOSIS — F4310 Post-traumatic stress disorder, unspecified: Secondary | ICD-10-CM

## 2020-07-23 DIAGNOSIS — F250 Schizoaffective disorder, bipolar type: Secondary | ICD-10-CM

## 2020-07-23 MED ORDER — BUSPIRONE HCL 10 MG PO TABS: 10.0000 mg | ORAL_TABLET | Freq: Three times a day (TID) | ORAL | 1 refills | Status: DC

## 2020-07-23 NOTE — Progress Notes (Signed)
BH MD/PA/NP OP Progress Note  Virtual Visit via Telephone Note  I connected with Walter Howe on 07/23/20 at  4:00 PM EST by telephone and verified that I am speaking with the correct person using two identifiers.  Location: Patient: Home Provider: Clinic   I discussed the limitations, risks, security and privacy concerns of performing an evaluation and management service by telephone and the availability of in person appointments. I also discussed with the patient that there may be a patient responsible charge related to this service. The patient expressed understanding and agreed to proceed.  Follow Up Instructions:   I discussed the assessment and treatment plan with the patient. The patient was provided an opportunity to ask questions and all were answered. The patient agreed with the plan and demonstrated an understanding of the instructions.   The patient was advised to call back or seek an in-person evaluation if the symptoms worsen or if the condition fails to improve as anticipated.  I provided 23 minutes of non-face-to-face time during this encounter.  Meta Hatchet, PA   07/23/2020 3:37 PM Walter Howe  MRN:  [id number]  Chief Complaint: Follow up and medication management  HPI:   Walter Howe is a 35 year old male with a past psychiatric history significant for PTSD, bipolar affective disorder, generalized anxiety disorder, and insomnia who presents to [facility] via virtual telephone visit for follow-up and medication management.  Patient is currently being managed on the following medications:  Fluoxetine 60 mg daily Buspirone 7.5 mg 3 times daily Olanzapine 20 mg at bedtime Depakote 250 mg 3 times daily Trazodone 50 mg at bedtime  Patient reports that he is still dealing with the same level of anxiety.  Patient states that he may experience roughly 1 panic attack a week and states that he almost experienced a panic  attack today.  Patient reports no new stressors but attributes work as a cause for his anxiety.  Patient states that work has been especially stressful since they are currently in their busy season.  A GAD-7 screen was performed today with the patient scoring a 21.  Patient shows interest in adjusting his buspirone dose for the management of his anxiety.  Patient is pleasant, calm, cooperative, and fully engaged in conversation during the encounter.  Patient reports that he is anxious due to being at work.  Patient denies suicidal or homicidal ideations.  He further denies auditory or visual hallucinations.  Patient endorses good sleep and receives on average 8 to 10 hours of sleep each night.  Patient endorses fair appetite and states that he usually has 1 meal during the day with some snacking throughout.  While at work, patient states that he usually has some lunch and snacks from time to time.  Patient denies alcohol consumption and illicit drug use.  Patient endorses tobacco use and smokes roughly 5 cigarettes/day.  Visit Diagnosis: No diagnosis found.  Past Psychiatric History: Schizoaffective disorder, bipolar type PTSD Major depressive disorder Generalized anxiety disorder Insomnia  Past Medical History:  Past Medical History:  Diagnosis Date  . Anxiety    03/05/2019- in the past, not current  . History of kidney stones    passed    Past Surgical History:  Procedure Laterality Date  . NO PAST SURGERIES    . ORIF HUMERUS FRACTURE Right 03/07/2019   Procedure: OPEN REDUCTION INTERNAL FIXATION (ORIF) RIGHT HUMERUS;  Surgeon: Tarry Kos, MD;  Location: [facility];  Service:  Orthopedics;  Laterality: Right;    Family Psychiatric History:  Unknown  Family History: No family history on file.  Social History:  Social History   Socioeconomic History  . Marital status: Single    Spouse name: Not on file  . Number of children: Not on file  . Years of education: Not on file  .  Highest education level: Not on file  Occupational History  . Not on file  Tobacco Use  . Smoking status: Former Smoker    Quit date: 02/20/2019    Years since quitting: 1.4  . Smokeless tobacco: Never Used  Vaping Use  . Vaping Use: Every day  . Devices: used i n attempt to stop smoking- did n ot work , quit vaping- 2019  Substance and Sexual Activity  . Alcohol use: Yes    Comment: ocassional  . Drug use: Yes    Comment: vicodin  . Sexual activity: Not on file  Other Topics Concern  . Not on file  Social History Narrative  . Not on file   Social Determinants of Health   Financial Resource Strain: Not on file  Food Insecurity: Not on file  Transportation Needs: Not on file  Physical Activity: Not on file  Stress: Not on file  Social Connections: Not on file    Allergies: No Known Allergies  Metabolic Disorder Labs: Lab Results  Component Value Date   HGBA1C 4.7 (L) 12/19/2019   MPG 88.19 12/19/2019   MPG 93.93 05/27/2019   No results found for: PROLACTIN Lab Results  Component Value Date   CHOL 241 (H) 12/19/2019   TRIG 107 12/19/2019   HDL 74 12/19/2019   CHOLHDL 3.3 12/19/2019   VLDL 21 12/19/2019   LDLCALC 146 (H) 12/19/2019   LDLCALC UNABLE TO CALCULATE IF TRIGLYCERIDE OVER 400 mg/dL 16/10/960401/08/2019   Lab Results  Component Value Date   TSH 4.728 (H) 12/19/2019   TSH 2.742 05/27/2019    Therapeutic Level Labs: No results found for: LITHIUM Lab Results  Component Value Date   VALPROATE <10 (L) 12/19/2019   No components found for:  CBMZ  Current Medications: Current Outpatient Medications  Medication Sig Dispense Refill  . busPIRone (BUSPAR) 7.5 MG tablet Take 1 tablet (7.5 mg total) by mouth 3 (three) times daily. 90 tablet 1  . divalproex (DEPAKOTE) 250 MG DR tablet Take 1 tablet (250 mg total) by mouth 3 (three) times daily. 90 tablet 2  . FLUoxetine (PROZAC) 20 MG capsule Take 3 capsules (60 mg total) by mouth daily. 90 capsule 1  .  hydrOXYzine (ATARAX/VISTARIL) 25 MG tablet Take 1 tablet (25 mg total) by mouth 3 (three) times daily as needed for anxiety. (Patient not taking: Reported on 12/19/2019) 75 tablet 0  . nicotine polacrilex (NICORETTE) 2 MG gum Take 1 each (2 mg total) by mouth as needed. (May buy from over the counter): For smoking cessation (Patient not taking: Reported on 12/19/2019) 100 tablet 0  . OLANZapine (ZYPREXA) 20 MG tablet Take 1 tablet (20 mg total) by mouth at bedtime. 30 tablet 2  . omega-3 acid ethyl esters (LOVAZA) 1 g capsule Take 1 capsule (1 g total) by mouth 2 (two) times daily. For high cholesterol (Patient not taking: Reported on 12/19/2019) 30 capsule 0  . Prenatal Vit-Fe Fumarate-FA (PRENATAL MULTIVITAMIN) TABS tablet Take 1 tablet by mouth daily at 12 noon. (May buy from over the counter): Vitamin supplementation (Patient not taking: Reported on 12/19/2019)    . traZODone (DESYREL)  50 MG tablet Take 1 tablet (50 mg total) by mouth at bedtime as needed for sleep. 30 tablet 2   No current facility-administered medications for this visit.     Musculoskeletal: Strength & Muscle Tone: Unable to assess due to telemedicine visit Gait & Station: Unable to assess due to telemedicine visit Patient leans: Unable to assess due to telemedicine visit  Psychiatric Specialty Exam: Review of Systems  Psychiatric/Behavioral: Negative for decreased concentration, dysphoric mood, hallucinations, self-injury, sleep disturbance and suicidal ideas. The patient is nervous/anxious. The patient is not hyperactive.     There were no vitals taken for this visit.There is no height or weight on file to calculate BMI.  General Appearance: Unable to assess due to telemedicine visit  Eye Contact:  Unable to assess due to telemedicine visit  Speech:  Clear and Coherent and Normal Rate  Volume:  Normal  Mood:  Anxious and Euthymic  Affect:  Appropriate and Congruent  Thought Process:  Coherent, Goal Directed and  Descriptions of Associations: Intact  Orientation:  Full (Time, Place, and Person)  Thought Content: WDL and Logical   Suicidal Thoughts:  No  Homicidal Thoughts:  No  Memory:  Immediate;   Good Recent;   Good Remote;   Good  Judgement:  Good  Insight:  Good  Psychomotor Activity:  Restlessness  Concentration:  Concentration: Good and Attention Span: Good  Recall:  Good  Fund of Knowledge: Good  Language: Good  Akathisia:  NA  Handed:  Right  AIMS (if indicated): not done  Assets:  Communication Skills Desire for Improvement Housing  ADL's:  Intact  Cognition: WNL  Sleep:  Good   Screenings: AIMS   Flowsheet Row Ad[facility] (Discharged) [facility]  AIMS Total Score 0    AUDIT   Flowsheet Row Admission (Discharged) [facility]  Alcohol Use Disorder Identification Test Final Score (AUDIT) 2    Flowsheet Row Admission (Discharged) from [facility]  C-SSRS RISK CATEGORY High Risk       Assessment and Plan:   Sadao Weyer is a 35 year old male with a past psychiatric history significant for PTSD, bipolar affective disorder, generalized anxiety disorder, and insomnia who presents to [facility] via virtual telephone visit for follow-up and medication management.  Patient expresses that he is still dealing with the same level of anxiety.  Patient shows interest in increasing his dosage of buspirone for the management of his anxiety.  Patient was recommended to increase his dosage of buspirone from 7.5 mg to 10 mg 3 times daily for the management of anxiety.  Patient was agreeable to recommendation.  Patient's medication will be e-prescribed to pharmacy of choice.  1. Insomnia, unspecified type Patient to continue taking trazodone 50 mg at bedtime for  the management of his insomnia Patient to continue taking olanzapine 20 mg at bedtime for the management of his insomnia  2. Generalized anxiety disorder Patient to continue taking his fluoxetine 60 mg daily for the management of his generalized anxiety disorder  - busPIRone (BUSPAR) 10 MG tablet; Take 1 tablet (10 mg total) by mouth 3 (three) times daily.  Dispense: 90 tablet; Refill: 1  3. Posttraumatic stress disorder Patient to continue taking fluoxetine 60 mg daily for the management of his PTSD  4. Schizoaffective disorder, bipolar type [facility]) Patient  to continue taking olanzapine 20 mg at bedtime for the management of his schizoaffective disorder Patient to continue taking divalproex 250 mg by mouth 3 times daily for the management of his schizoaffective disorder Patient to continue taking fluoxetine 60 mg daily for the management of his schizoaffective disorder  Patient to follow-up in 6 weeks  Meta Hatchet, PA 07/23/2020, 3:37 PM

## 2020-09-01 ENCOUNTER — Telehealth (INDEPENDENT_AMBULATORY_CARE_PROVIDER_SITE_OTHER): Payer: No Payment, Other | Admitting: Family

## 2020-09-01 ENCOUNTER — Other Ambulatory Visit: Payer: Self-pay

## 2020-09-01 DIAGNOSIS — F4310 Post-traumatic stress disorder, unspecified: Secondary | ICD-10-CM | POA: Diagnosis not present

## 2020-09-01 DIAGNOSIS — F331 Major depressive disorder, recurrent, moderate: Secondary | ICD-10-CM | POA: Diagnosis not present

## 2020-09-01 DIAGNOSIS — F250 Schizoaffective disorder, bipolar type: Secondary | ICD-10-CM | POA: Diagnosis not present

## 2020-09-01 DIAGNOSIS — G4700 Insomnia, unspecified: Secondary | ICD-10-CM

## 2020-09-01 DIAGNOSIS — F411 Generalized anxiety disorder: Secondary | ICD-10-CM | POA: Diagnosis not present

## 2020-09-01 MED ORDER — DIVALPROEX SODIUM 250 MG PO DR TAB
250.0000 mg | DELAYED_RELEASE_TABLET | Freq: Three times a day (TID) | ORAL | 2 refills | Status: DC
Start: 2020-09-01 — End: 2020-11-20

## 2020-09-01 MED ORDER — BUSPIRONE HCL 10 MG PO TABS: 10.0000 mg | ORAL_TABLET | Freq: Three times a day (TID) | ORAL | 1 refills | Status: DC

## 2020-09-01 MED ORDER — TRAZODONE HCL 50 MG PO TABS: 50.0000 mg | ORAL_TABLET | Freq: Every evening | ORAL | 2 refills | Status: DC | PRN

## 2020-09-01 MED ORDER — QUETIAPINE FUMARATE 25 MG PO TABS
25.0000 mg | ORAL_TABLET | Freq: Two times a day (BID) | ORAL | 0 refills | Status: DC
Start: 2020-09-01 — End: 2020-11-20

## 2020-09-01 MED ORDER — FLUOXETINE HCL 20 MG PO CAPS: 60.0000 mg | ORAL_CAPSULE | Freq: Every day | ORAL | 1 refills | Status: DC

## 2020-09-01 MED ORDER — QUETIAPINE FUMARATE 100 MG PO TABS: 100.0000 mg | ORAL_TABLET | Freq: Every day | ORAL | 0 refills | Status: DC

## 2020-09-01 NOTE — Progress Notes (Signed)
Virtual Visit via Telephone Note  I connected with Walter Howe on 09/01/20 at  3:00 PM EDT by telephone and verified that I am speaking with the correct person using two identifiers.  Location: Patient: Home Provider: Office   I discussed the limitations, risks, security and privacy concerns of performing an evaluation and management service by telephone and the availability of in person appointments. I also discussed with the patient that there may be a patient responsible charge related to this service. The patient expressed understanding and agreed to proceed.    I discussed the assessment and treatment plan with the patient. The patient was provided an opportunity to ask questions and all were answered. The patient agreed with the plan and demonstrated an understanding of the instructions.   The patient was advised to call back or seek an in-person evaluation if the symptoms worsen or if the condition fails to improve as anticipated.  I provided 15 minutes of non-face-to-face time during this encounter.   Walter Rack, NP   BH MD/PA/NP OP Progress Note  09/01/2020 3:25 PM Walter Howe  MRN:  [id number]  Visit Diagnosis:    ICD-10-CM   1. Schizoaffective disorder, bipolar type (HCC)  F25.0    Walter Howe was evaluated via telephone assessment.  Patient is unable to connect via [facility].  Charted history with generalized anxiety disorder, bipolar affective, schizoaffective disorder,  posttraumatic stress disorder and insomnia.  Reported taking fluoxetine, BuSpar or olanzapine Depakote and trazodone for mood stabilization.  He reports taking medication for the past year without any relief from his anxiety.  Reports ongoing mood swings and anxiety attacks weekly.  Discussed discontinuing Zyprexa 20 mg nightly will initiate Seroquel 100 mg p.o. nightly and 25 mg twice daily for mood stabilization. patient was receptive to plan.   Reports his medications have been slowly  titrated. "  No relief" with current regimen.   Walter Howe denied any recent inpatient admissions.  Denied any illicit drug use/abuse.  Reports a fair appetite.  States he is resting well throughout the night.  Support, encouragement and reassurance was provided.  Past Psychiatric History:   Past Medical History:  Past Medical History:  Diagnosis Date  . Anxiety    03/05/2019- in the past, not current  . History of kidney stones    passed    Past Surgical History:  Procedure Laterality Date  . NO PAST SURGERIES    . ORIF HUMERUS FRACTURE Right 03/07/2019   Procedure: OPEN REDUCTION INTERNAL FIXATION (ORIF) RIGHT HUMERUS;  Surgeon: Walter Kos, MD;  Location: [facility];  Service: Orthopedics;  Laterality: Right;    Family Psychiatric History:   Family History: No family history on file.  Social History:  Social History   Socioeconomic History  . Marital status: Single    Spouse name: Not on file  . Number of children: Not on file  . Years of education: Not on file  . Highest education level: Not on file  Occupational History  . Not on file  Tobacco Use  . Smoking status: Former Smoker    Quit date: 02/20/2019    Years since quitting: 1.5  . Smokeless tobacco: Never Used  Vaping Use  . Vaping Use: Every day  . Devices: used i n attempt to stop smoking- did n ot work , quit vaping- 2019  Substance and Sexual Activity  . Alcohol use: Yes    Comment: ocassional  . Drug use: Yes    Comment: vicodin  .  Sexual activity: Not on file  Other Topics Concern  . Not on file  Social History Narrative  . Not on file   Social Determinants of Health   Financial Resource Strain: Not on file  Food Insecurity: Not on file  Transportation Needs: Not on file  Physical Activity: Not on file  Stress: Not on file  Social Connections: Not on file    Allergies: No Known Allergies  Metabolic Disorder Labs: Lab Results  Component Value Date   HGBA1C 4.7 (L) 12/19/2019   MPG 88.19  12/19/2019   MPG 93.93 05/27/2019   No results found for: PROLACTIN Lab Results  Component Value Date   CHOL 241 (H) 12/19/2019   TRIG 107 12/19/2019   HDL 74 12/19/2019   CHOLHDL 3.3 12/19/2019   VLDL 21 12/19/2019   LDLCALC 146 (H) 12/19/2019   LDLCALC UNABLE TO CALCULATE IF TRIGLYCERIDE OVER 400 mg/dL 07/37/1062   Lab Results  Component Value Date   TSH 4.728 (H) 12/19/2019   TSH 2.742 05/27/2019    Therapeutic Level Labs: No results found for: LITHIUM Lab Results  Component Value Date   VALPROATE <10 (L) 12/19/2019   No components found for:  CBMZ  Current Medications: Current Outpatient Medications  Medication Sig Dispense Refill  . busPIRone (BUSPAR) 10 MG tablet Take 1 tablet (10 mg total) by mouth 3 (three) times daily. 90 tablet 1  . divalproex (DEPAKOTE) 250 MG DR tablet Take 1 tablet (250 mg total) by mouth 3 (three) times daily. 90 tablet 2  . FLUoxetine (PROZAC) 20 MG capsule Take 3 capsules (60 mg total) by mouth daily. 90 capsule 1  . hydrOXYzine (ATARAX/VISTARIL) 25 MG tablet Take 1 tablet (25 mg total) by mouth 3 (three) times daily as needed for anxiety. (Patient not taking: Reported on 12/19/2019) 75 tablet 0  . nicotine polacrilex (NICORETTE) 2 MG gum Take 1 each (2 mg total) by mouth as needed. (May buy from over the counter): For smoking cessation (Patient not taking: Reported on 12/19/2019) 100 tablet 0  . omega-3 acid ethyl esters (LOVAZA) 1 g capsule Take 1 capsule (1 g total) by mouth 2 (two) times daily. For high cholesterol (Patient not taking: Reported on 12/19/2019) 30 capsule 0  . Prenatal Vit-Fe Fumarate-FA (PRENATAL MULTIVITAMIN) TABS tablet Take 1 tablet by mouth daily at 12 noon. (May buy from over the counter): Vitamin supplementation (Patient not taking: Reported on 12/19/2019)    . traZODone (DESYREL) 50 MG tablet Take 1 tablet (50 mg total) by mouth at bedtime as needed for sleep. 30 tablet 2   No current facility-administered medications for  this visit.     Musculoskeletal:   Psychiatric Specialty Exam: Review of Systems  There were no vitals taken for this visit.There is no height or weight on file to calculate BMI.  General Appearance: NA  Eye Contact:  NA  Speech:  Clear and Coherent  Volume:  Normal  Mood:  Anxious and Depressed  Affect:  Ongoing anxiety and anxiousness all the time"  Thought Process:  Coherent  Orientation:  Full (Time, Place, and Person)  Thought Content: Logical   Suicidal Thoughts:  No  Homicidal Thoughts:  No  Memory:  Immediate;   Fair Recent;   Fair  Judgement:  Fair  Insight:  Fair  Psychomotor Activity:  Normal  Concentration:  Concentration: Fair  Recall:  [employer] of Knowledge: Fair  Language: Fair  Akathisia:  No  Handed:  Right  AIMS (if indicated):  Assets:  Communication Skills Desire for Improvement Resilience Social Support  ADL's:  Intact  Cognition: WNL  Sleep:  Fair   Screenings: AIMS   Flowsheet Row Ad[facility] (Discharged) [facility]  AIMS Total Score 0    AUDIT   Flowsheet Row Admission (Discharged) from [facility]  Alcohol Use Disorder Identification Test Final Score (AUDIT) 2    GAD-7   Flowsheet Row Video Visit [facility] 07/23/2020 in [facility]  Total GAD-7 Score 21    PHQ2-9   Flowsheet Row Video Visit from 07/23/2020 in [facility]  PHQ-2 Total Score 2  PHQ-9 Total Score 10    Flowsheet Row Video Visit from 07/23/2020 in [facility] Admission (Discharged) from [facility]  C-SSRS RISK CATEGORY No Risk High Risk       Assessment and Plan:   Schizoaffective: Major depressive disorder Generalized anxiety disorder:  Continue fluoxetine 60 mg p.o. daily Discontinue olanzapine 20  mg p.o. nightly Initiated Seroquel 100 mg p.o. nightly and 25 mg p.o. twice daily Continue trazodone 50 mg p.o. nightly for sleeping difficulties Increased BuSpar 7.5 mg to 10 mg p.o. 3 times daily  Patient to follow-up in 4 weeks for medication adherence/tolerance -Follow-up for EKG, prolactin, lipid and A1c  Walter Rack, NP 09/01/2020, 3:25 PM

## 2020-09-04 ENCOUNTER — Telehealth (HOSPITAL_COMMUNITY): Payer: No Payment, Other | Admitting: Physician Assistant

## 2020-09-22 ENCOUNTER — Telehealth (HOSPITAL_COMMUNITY): Payer: Self-pay | Admitting: Physician Assistant

## 2020-09-22 NOTE — Telephone Encounter (Signed)
Father, Walter Howe, called requesting provider return or see pt in office. Advised father, provider out of office today. Advised if pt is having extreme worrisome sudden changes in behavior or life-threatening behaviors to take pt to [facility] or ED.   Father verbalized understanding.   [name] [phone]

## 2020-09-23 NOTE — Telephone Encounter (Signed)
Message received and verified by provider.

## 2020-10-07 ENCOUNTER — Telehealth (INDEPENDENT_AMBULATORY_CARE_PROVIDER_SITE_OTHER): Payer: No Payment, Other | Admitting: Physician Assistant

## 2020-10-07 ENCOUNTER — Encounter (HOSPITAL_COMMUNITY): Payer: Self-pay | Admitting: Physician Assistant

## 2020-10-07 DIAGNOSIS — F250 Schizoaffective disorder, bipolar type: Secondary | ICD-10-CM

## 2020-10-07 DIAGNOSIS — F4310 Post-traumatic stress disorder, unspecified: Secondary | ICD-10-CM | POA: Diagnosis not present

## 2020-10-07 DIAGNOSIS — F411 Generalized anxiety disorder: Secondary | ICD-10-CM

## 2020-10-07 MED ORDER — BUSPIRONE HCL 15 MG PO TABS: 15.0000 mg | ORAL_TABLET | Freq: Three times a day (TID) | ORAL | 1 refills | Status: DC

## 2020-10-07 MED ORDER — FLUOXETINE HCL 40 MG PO CAPS: 80.0000 mg | ORAL_CAPSULE | Freq: Every day | ORAL | 1 refills | Status: DC

## 2020-10-07 NOTE — Progress Notes (Signed)
BH MD/PA/NP OP Progress Note  Virtual Visit via Telephone Note  I connected with Walter Howe on 10/07/20 at  4:30 PM EDT by telephone and verified that I am speaking with the correct person using two identifiers.  Location: Patient: Home Provider: Clinic   I discussed the limitations, risks, security and privacy concerns of performing an evaluation and management service by telephone and the availability of in person appointments. I also discussed with the patient that there may be a patient responsible charge related to this service. The patient expressed understanding and agreed to proceed.  Follow Up Instructions:    I discussed the assessment and treatment plan with the patient. The patient was provided an opportunity to ask questions and all were answered. The patient agreed with the plan and demonstrated an understanding of the instructions.   The patient was advised to call back or seek an in-person evaluation if the symptoms worsen or if the condition fails to improve as anticipated.  I provided 21 minutes of non-face-to-face time during this encounter.  Meta Hatchet, PA   10/07/2020 10:43 PM Walter Howe  MRN:  [id number]  Chief Complaint: Follow up and medication management  HPI:   Walter Howe is a 35 year old male with a past psychiatric history significant for PTSD, bipolar affective disorder, generalized anxiety disorder, and insomnia who presents to [facility] via virtual telephone visit for follow-up and medication management.  Patient is currently being managed on the following medications:  Seroquel 100 mg at bedtime Seroquel 25 mg 2 times daily Fluoxetine 60 mg daily BuSpar 10 mg 3 times daily Trazodone 50 mg at bedtime Depakote 250 mg 3 times daily  Today, patient reports that his anxiety is getting worse and that he is having panic attacks more frequently.  Patient has also noted that he has been  experiencing lack of focus, decreased concentration, and poor memory.  Patient reports that he has noticed that he is sometimes unable to put together his sentences. Patient reports that he feels worse than before his medications were changed.  Patient denies any new stressors or life changing events.  Patient reports that his anxiety has gotten so bad that he has had to call out of work for more than 2 weeks.  A PHQ-9 screen was performed with the patient scored a 17.  A GAD-7 screen was also performed with the patient scoring a 19.  Patient is calm, cooperative, and fully engaged in conversation during the encounter.  Patient reports that he feels depressed and anxious.  Patient denies suicidal or homicidal ideations.  He further denies auditory or visual hallucinations and does not appear to be responding to internal/external stimuli.  Patient endorses hypersomnia and states that he has been receiving roughly 14 hours of sleep each day due to his worsening anxiety.  Patient endorses poor appetite and states that he has been eating "a little here and a little there" throughout the day.  Patient denies alcohol consumption and illicit drug use.  Patient denies current tobacco use and states that he has not been using tobacco for the past 2 weeks.  Visit Diagnosis:    ICD-10-CM   1. Generalized anxiety disorder  F41.1 busPIRone (BUSPAR) 15 MG tablet    FLUoxetine (PROZAC) 40 MG capsule  2. Schizoaffective disorder, bipolar type (HCC)  F25.0 FLUoxetine (PROZAC) 40 MG capsule  3. Posttraumatic stress disorder  F43.10 FLUoxetine (PROZAC) 40 MG capsule    Past Psychiatric History:  Schizoaffective disorder, bipolar type PTSD Major depressive disorder Generalized anxiety disorder Insomnia  Past Medical History:  Past Medical History:  Diagnosis Date  . Anxiety    03/05/2019- in the past, not current  . History of kidney stones    passed    Past Surgical History:  Procedure Laterality Date  .  NO PAST SURGERIES    . ORIF HUMERUS FRACTURE Right 03/07/2019   Procedure: OPEN REDUCTION INTERNAL FIXATION (ORIF) RIGHT HUMERUS;  Surgeon: Tarry KosXu, Naiping M, MD;  Location: [facility] OR;  Service: Orthopedics;  Laterality: Right;    Family Psychiatric History:  Unknown  Family History: History reviewed. No pertinent family history.  Social History:  Social History   Socioeconomic History  . Marital status: Single    Spouse name: Not on file  . Number of children: Not on file  . Years of education: Not on file  . Highest education level: Not on file  Occupational History  . Not on file  Tobacco Use  . Smoking status: Former Smoker    Quit date: 02/20/2019    Years since quitting: 1.6  . Smokeless tobacco: Never Used  Vaping Use  . Vaping Use: Every day  . Devices: used i n attempt to stop smoking- did n ot work , quit vaping- 2019  Substance and Sexual Activity  . Alcohol use: Yes    Comment: ocassional  . Drug use: Yes    Comment: vicodin  . Sexual activity: Not on file  Other Topics Concern  . Not on file  Social History Narrative  . Not on file   Social Determinants of Health   Financial Resource Strain: Not on file  Food Insecurity: Not on file  Transportation Needs: Not on file  Physical Activity: Not on file  Stress: Not on file  Social Connections: Not on file    Allergies: No Known Allergies  Metabolic Disorder Labs: Lab Results  Component Value Date   HGBA1C 4.7 (L) 12/19/2019   MPG 88.19 12/19/2019   MPG 93.93 05/27/2019   No results found for: PROLACTIN Lab Results  Component Value Date   CHOL 241 (H) 12/19/2019   TRIG 107 12/19/2019   HDL 74 12/19/2019   CHOLHDL 3.3 12/19/2019   VLDL 21 12/19/2019   LDLCALC 146 (H) 12/19/2019   LDLCALC UNABLE TO CALCULATE IF TRIGLYCERIDE OVER 400 mg/dL 78/29/562101/08/2019   Lab Results  Component Value Date   TSH 4.728 (H) 12/19/2019   TSH 2.742 05/27/2019    Therapeutic Level Labs: No results found for:  LITHIUM Lab Results  Component Value Date   VALPROATE <10 (L) 12/19/2019   No components found for:  CBMZ  Current Medications: Current Outpatient Medications  Medication Sig Dispense Refill  . busPIRone (BUSPAR) 15 MG tablet Take 1 tablet (15 mg total) by mouth 3 (three) times daily. 90 tablet 1  . divalproex (DEPAKOTE) 250 MG DR tablet Take 1 tablet (250 mg total) by mouth 3 (three) times daily. 90 tablet 2  . FLUoxetine (PROZAC) 40 MG capsule Take 2 capsules (80 mg total) by mouth daily. 60 capsule 1  . hydrOXYzine (ATARAX/VISTARIL) 25 MG tablet Take 1 tablet (25 mg total) by mouth 3 (three) times daily as needed for anxiety. (Patient not taking: Reported on 12/19/2019) 75 tablet 0  . nicotine polacrilex (NICORETTE) 2 MG gum Take 1 each (2 mg total) by mouth as needed. (May buy from over the counter): For smoking cessation (Patient not taking: Reported on 12/19/2019) 100 tablet 0  .  omega-3 acid ethyl esters (LOVAZA) 1 g capsule Take 1 capsule (1 g total) by mouth 2 (two) times daily. For high cholesterol (Patient not taking: Reported on 12/19/2019) 30 capsule 0  . Prenatal Vit-Fe Fumarate-FA (PRENATAL MULTIVITAMIN) TABS tablet Take 1 tablet by mouth daily at 12 noon. (May buy from over the counter): Vitamin supplementation (Patient not taking: Reported on 12/19/2019)    . QUEtiapine (SEROQUEL) 100 MG tablet Take 1 tablet (100 mg total) by mouth at bedtime. 30 tablet 0  . QUEtiapine (SEROQUEL) 25 MG tablet Take 1 tablet (25 mg total) by mouth 2 (two) times daily. 60 tablet 0  . traZODone (DESYREL) 50 MG tablet Take 1 tablet (50 mg total) by mouth at bedtime as needed for sleep. 30 tablet 2   No current facility-administered medications for this visit.     Musculoskeletal:  Strength & Muscle Tone: Unable to assess due to telemedicine visit Gait & Station: Unable to assess due to telemedicine visit Patient leans: Unable to assess due to telemedicine visit  Psychiatric Specialty  Exam: Review of Systems  Psychiatric/Behavioral: Positive for agitation, decreased concentration, dysphoric mood and sleep disturbance. Negative for hallucinations, self-injury and suicidal ideas. The patient is nervous/anxious. The patient is not hyperactive.     There were no vitals taken for this visit.There is no height or weight on file to calculate BMI.  General Appearance: Unable to assess due to telemedicine visit  Eye Contact:  Unable to assess due to telemedicine visit  Speech:  Clear and Coherent and Normal Rate  Volume:  Normal  Mood:  Anxious, Depressed, Dysphoric and Irritable  Affect:  Congruent and Depressed  Thought Process:  Coherent, Goal Directed and Descriptions of Associations: Intact  Orientation:  Full (Time, Place, and Person)  Thought Content: Logical   Suicidal Thoughts:  No  Homicidal Thoughts:  No  Memory:  Immediate;   Fair Recent;   Good Remote;   Good  Judgement:  Good  Insight:  Good  Psychomotor Activity:  Restlessness  Concentration:  Concentration: Good and Attention Span: Fair  Recall:  Good  Fund of Knowledge: Good  Language: Good  Akathisia:  NA  Handed:  Right  AIMS (if indicated): not done  Assets:  Communication Skills Desire for Improvement Housing  ADL's:  Intact  Cognition: WNL  Sleep:  Fair   Screenings: AIMS   Flowsheet Row Admission (Discharged) [facility]  AIMS Total Score 0    AUDIT   Flowsheet Row Admission (Discharged) from [facility]  Alcohol Use Disorder Identification Test Final Score (AUDIT) 2    GAD-7   Flowsheet Row Video Visit from 10/07/2020 in [facility] Video Visit from 09/01/2020 in [facility] Video Visit from 07/23/2020 in [facility]  Total GAD-7 Score 19 18 21     PHQ2-9   Flowsheet Row Video  Visit from 10/07/2020 in [facility] Video Visit from 07/23/2020 in [facility]  PHQ-2 Total Score 5 2  PHQ-9 Total Score 17 10    Flowsheet Row Video Visit from 10/07/2020 in [facility] Video Visit from 07/23/2020 in [facility] Admission (Discharged) from [facility]  C-SSRS RISK CATEGORY High Risk No Risk High Risk  Assessment and Plan:   Krista Som is a 35 year old male with a past psychiatric history significant for PTSD, bipolar affective disorder, generalized anxiety disorder, and insomnia who presents to [facility] via virtual telephone visit for follow-up and medication management.  Patient reports that he has been experiencing worsening anxiety and an increase in the frequency of his panic attacks.  In addition to worsening anxiety, patient also expresses that he has been having difficulty with his memory, lack of focus, and decreased concentration.  Patient recently had his medications adjusted during his last encounter but states that he feels worse off than before.  Patient was recommended increasing his dosage of Prozac from 60mg  to 80 mg daily for the management of his anxiety/panic disorder.  Patient was also recommended increasing his buspirone dosage from 10 mg to 15 mg 3 times daily for the management of his anxiety.  Patient was agreeable to recommendation.  Patient to discontinue taking Seroquel 25 mg 2 times daily.  Patient's medications to be e-prescribed to pharmacy of choice.  1. Generalized anxiety disorder  - busPIRone (BUSPAR) 15 MG tablet; Take 1 tablet (15 mg total) by mouth 3 (three) times daily.  Dispense: 90 tablet; Refill: 1 - FLUoxetine (PROZAC) 40 MG capsule; Take 2 capsules (80 mg total) by mouth daily.  Dispense: 60 capsule; Refill: 1  2.  Schizoaffective disorder, bipolar type (HCC)  - FLUoxetine (PROZAC) 40 MG capsule; Take 2 capsules (80 mg total) by mouth daily.  Dispense: 60 capsule; Refill: 1  3. Posttraumatic stress disorder  - FLUoxetine (PROZAC) 40 MG capsule; Take 2 capsules (80 mg total) by mouth daily.  Dispense: 60 capsule; Refill: 1  Patient to follow-up in 6 weeks  , PA 10/07/2020, 10:43 PM

## 2020-11-19 ENCOUNTER — Telehealth (INDEPENDENT_AMBULATORY_CARE_PROVIDER_SITE_OTHER): Payer: No Payment, Other | Admitting: Physician Assistant

## 2020-11-19 ENCOUNTER — Other Ambulatory Visit: Payer: Self-pay

## 2020-11-19 DIAGNOSIS — F411 Generalized anxiety disorder: Secondary | ICD-10-CM | POA: Diagnosis not present

## 2020-11-19 DIAGNOSIS — F4310 Post-traumatic stress disorder, unspecified: Secondary | ICD-10-CM

## 2020-11-19 DIAGNOSIS — F250 Schizoaffective disorder, bipolar type: Secondary | ICD-10-CM | POA: Diagnosis not present

## 2020-11-19 DIAGNOSIS — F410 Panic disorder [episodic paroxysmal anxiety] without agoraphobia: Secondary | ICD-10-CM | POA: Diagnosis not present

## 2020-11-19 DIAGNOSIS — G4700 Insomnia, unspecified: Secondary | ICD-10-CM

## 2020-11-20 DIAGNOSIS — F410 Panic disorder [episodic paroxysmal anxiety] without agoraphobia: Secondary | ICD-10-CM | POA: Insufficient documentation

## 2020-11-20 MED ORDER — FLUOXETINE HCL 40 MG PO CAPS: 80.0000 mg | ORAL_CAPSULE | Freq: Every day | ORAL | 1 refills | Status: DC

## 2020-11-20 MED ORDER — QUETIAPINE FUMARATE 50 MG PO TABS: 50.0000 mg | ORAL_TABLET | Freq: Every day | ORAL | 1 refills | Status: DC

## 2020-11-20 MED ORDER — CLONAZEPAM 0.5 MG PO TABS: 0.5000 mg | ORAL_TABLET | Freq: Two times a day (BID) | ORAL | 0 refills | Status: DC | PRN

## 2020-11-20 MED ORDER — DIVALPROEX SODIUM 250 MG PO DR TAB: 250.0000 mg | DELAYED_RELEASE_TABLET | Freq: Three times a day (TID) | ORAL | 2 refills | Status: DC

## 2020-11-23 ENCOUNTER — Encounter (HOSPITAL_COMMUNITY): Payer: Self-pay | Admitting: Physician Assistant

## 2020-11-23 NOTE — Progress Notes (Signed)
BH MD/PA/NP OP Progress Note  Virtual Visit via Telephone Note  I connected with Walter Howe on 11/23/20 at  4:30 PM EDT by telephone and verified that I am speaking with the correct person using two identifiers.  Location: Patient: Home Provider: Clinic   I discussed the limitations, risks, security and privacy concerns of performing an evaluation and management service by telephone and the availability of in person appointments. I also discussed with the patient that there may be a patient responsible charge related to this service. The patient expressed understanding and agreed to proceed.  Follow Up Instructions:  I discussed the assessment and treatment plan with the patient. The patient was provided an opportunity to ask questions and all were answered. The patient agreed with the plan and demonstrated an understanding of the instructions.   The patient was advised to call back or seek an in-person evaluation if the symptoms worsen or if the condition fails to improve as anticipated.  I provided 20 minutes of non-face-to-face time during this encounter.  Meta Hatchet, PA    11/19/2020 9:33 PM THURMAN SARVER  MRN:  [id number]  Chief Complaint: Follow up and medication management  HPI:   Walter Howe is a 35 year old male with a past psychiatric history significant for PTSD, schizoaffective disorder (bipolar type), generalized anxiety disorder, insomnia, and panic disorder who presents to [facility] via virtual telephone visit for follow-up and medication management.  Patient is currently being managed on the following medications:  BuSpar 15 mg 3 times daily Fluoxetine 80 mg daily Depakote 250 mg 3 times daily Seroquel 50 mg at bedtime  Patient expresses that he is still experiencing worsening anxiety and not much has changed since adjusting his dosages of Seroquel or fluoxetine.  Patient rates his anxiety a 9 out of 10.   Patient denies any new stressors but states that he has been experiencing panic attacks 3-5 times a week.  Patient's panic attacks are characterized by the following symptoms: shaking/trembling, nervousness, worsening anxiety, fearfulness, and difficulty breathing.  Patient also endorses the following depressive symptoms: low mood decreased energy, feelings of guilt or worthlessness, irritability, and fatigue.  Patient denies changes in appetite.  A PHQ-9 screen was performed with the patient scoring a 20.  A GAD-7 screen was also performed with the patient scoring an 18.  Patient is calm, cooperative, and fully engaged in conversation during the encounter.  Patient states that he is not as depressed today but still experiences lingering anxiety.  Patient denies suicidal or homicidal ideations.  He further denies auditory or visual hallucinations and does not appear to be responding to internal/external stimuli.  Patient endorses fair sleep and receives on average 12 hours of sleep each night.  Patient endorses decreased appetite and states that he eats small meals throughout the day.  Patient denies alcohol consumption, tobacco use, and illicit drug use.  Visit Diagnosis:    ICD-10-CM   1. Insomnia, unspecified type  G47.00 QUEtiapine (SEROQUEL) 50 MG tablet    2. Generalized anxiety disorder  F41.1 FLUoxetine (PROZAC) 40 MG capsule    clonazePAM (KLONOPIN) 0.5 MG tablet    3. Schizoaffective disorder, bipolar type (HCC)  F25.0 FLUoxetine (PROZAC) 40 MG capsule    QUEtiapine (SEROQUEL) 50 MG tablet    divalproex (DEPAKOTE) 250 MG DR tablet    4. Posttraumatic stress disorder  F43.10 FLUoxetine (PROZAC) 40 MG capsule    5. Panic disorder  F41.0 clonazePAM (KLONOPIN) 0.5  MG tablet      Past Psychiatric History:  Schizoaffective disorder, bipolar type PTSD Major depressive disorder Generalized anxiety disorder Insomnia  Past Medical History:  Past Medical History:  Diagnosis Date    Anxiety    03/05/2019- in the past, not current   History of kidney stones    passed    Past Surgical History:  Procedure Laterality Date   NO PAST SURGERIES     ORIF HUMERUS FRACTURE Right 03/07/2019   Procedure: OPEN REDUCTION INTERNAL FIXATION (ORIF) RIGHT HUMERUS;  Surgeon: Tarry KosXu, Naiping M, MD;  Location: [facility] OR;  Service: Orthopedics;  Laterality: Right;    Family Psychiatric History:  Unknown  Family History: No family history on file.  Social History:  Social History   Socioeconomic History   Marital status: Single    Spouse name: Not on file   Number of children: Not on file   Years of education: Not on file   Highest education level: Not on file  Occupational History   Not on file  Tobacco Use   Smoking status: Former    Pack years: 0.00    Types: Cigarettes    Quit date: 02/20/2019    Years since quitting: 1.7   Smokeless tobacco: Never  Vaping Use   Vaping Use: Every day   Devices: used i n attempt to stop smoking- did n ot work , quit vaping- 2019  Substance and Sexual Activity   Alcohol use: Yes    Comment: ocassional   Drug use: Yes    Comment: vicodin   Sexual activity: Not on file  Other Topics Concern   Not on file  Social History Narrative   Not on file   Social Determinants of Health   Financial Resource Strain: Not on file  Food Insecurity: Not on file  Transportation Needs: Not on file  Physical Activity: Not on file  Stress: Not on file  Social Connections: Not on file    Allergies: No Known Allergies  Metabolic Disorder Labs: Lab Results  Component Value Date   HGBA1C 4.7 (L) 12/19/2019   MPG 88.19 12/19/2019   MPG 93.93 05/27/2019   No results found for: PROLACTIN Lab Results  Component Value Date   CHOL 241 (H) 12/19/2019   TRIG 107 12/19/2019   HDL 74 12/19/2019   CHOLHDL 3.3 12/19/2019   VLDL 21 12/19/2019   LDLCALC 146 (H) 12/19/2019   LDLCALC UNABLE TO CALCULATE IF TRIGLYCERIDE OVER 400 mg/dL 62/95/284101/08/2019   Lab  Results  Component Value Date   TSH 4.728 (H) 12/19/2019   TSH 2.742 05/27/2019    Therapeutic Level Labs: No results found for: LITHIUM Lab Results  Component Value Date   VALPROATE <10 (L) 12/19/2019   No components found for:  CBMZ  Current Medications: Current Outpatient Medications  Medication Sig Dispense Refill   clonazePAM (KLONOPIN) 0.5 MG tablet Take 1 tablet (0.5 mg total) by mouth 2 (two) times daily as needed for anxiety. 60 tablet 0   busPIRone (BUSPAR) 15 MG tablet Take 1 tablet (15 mg total) by mouth 3 (three) times daily. 90 tablet 1   divalproex (DEPAKOTE) 250 MG DR tablet Take 1 tablet (250 mg total) by mouth 3 (three) times daily. 90 tablet 2   FLUoxetine (PROZAC) 40 MG capsule Take 2 capsules (80 mg total) by mouth daily. 60 capsule 1   hydrOXYzine (ATARAX/VISTARIL) 25 MG tablet Take 1 tablet (25 mg total) by mouth 3 (three) times daily as needed for anxiety. (  Patient not taking: Reported on 12/19/2019) 75 tablet 0   nicotine polacrilex (NICORETTE) 2 MG gum Take 1 each (2 mg total) by mouth as needed. (May buy from over the counter): For smoking cessation (Patient not taking: Reported on 12/19/2019) 100 tablet 0   omega-3 acid ethyl esters (LOVAZA) 1 g capsule Take 1 capsule (1 g total) by mouth 2 (two) times daily. For high cholesterol (Patient not taking: Reported on 12/19/2019) 30 capsule 0   Prenatal Vit-Fe Fumarate-FA (PRENATAL MULTIVITAMIN) TABS tablet Take 1 tablet by mouth daily at 12 noon. (May buy from over the counter): Vitamin supplementation (Patient not taking: Reported on 12/19/2019)     QUEtiapine (SEROQUEL) 50 MG tablet Take 1 tablet (50 mg total) by mouth at bedtime. 30 tablet 1   No current facility-administered medications for this visit.     Musculoskeletal: Strength & Muscle Tone: Unable to assess due to telemedicine visit Gait & Station: Unable to assess due to telemedicine visit Patient leans: Unable to assess due to telemedicine  visit  Psychiatric Specialty Exam: Review of Systems  Psychiatric/Behavioral:  Positive for dysphoric mood and sleep disturbance. Negative for decreased concentration, hallucinations, self-injury and suicidal ideas. The patient is nervous/anxious. The patient is not hyperactive.    There were no vitals taken for this visit.There is no height or weight on file to calculate BMI.  General Appearance: Unable to assess due to telemedicine visit  Eye Contact:  Unable to assess due to telemedicine visit  Speech:  Clear and Coherent and Normal Rate  Volume:  Normal  Mood:  Anxious, Depressed, and Irritable  Affect:  Congruent and Depressed  Thought Process:  Coherent and Descriptions of Associations: Intact  Orientation:  Full (Time, Place, and Person)  Thought Content: WDL   Suicidal Thoughts:  No  Homicidal Thoughts:  No  Memory:  Immediate;   Good Recent;   Good Remote;   Good  Judgement:  Good  Insight:  Good  Psychomotor Activity:  Restlessness  Concentration:  Concentration: Good and Attention Span: Good  Recall:  Good  Fund of Knowledge: Good  Language: Good  Akathisia:  NA  Handed:  Right  AIMS (if indicated): not done  Assets:  Communication Skills Desire for Improvement Housing  ADL's:  Intact  Cognition: WNL  Sleep:  Fair   Screenings: AIMS    Flowsheet Row Ad[facility] (Dis[facility]  AIMS Total Score 0      AUDIT    Flowsheet Row Admission (Discharged) from [facility]  Alcohol Use Disorder Identification Test Final Score (AUDIT) 2      GAD-7    Flowsheet Row Video Visit from 11/19/2020 in [facility] Video Visit from 10/07/2020 in [facility] Video Visit from 09/01/2020 in [facility] Video Visit from 07/23/2020 in [facility]  Total GAD-7 Score 18 19 18 21       PHQ2-9    Flowsheet Row Video Visit from 11/19/2020 in [facility] Video Visit from 10/07/2020 in [facility] Video Visit from 07/23/2020 in [facility]  PHQ-2 Total Score 6 5 2   PHQ-9 Total Score 20 17 10       Flowsheet Row Video Visit from 11/19/2020 in [facility] Video Visit from 10/07/2020 in [facility] Video Visit from 07/23/2020 in [facility]  C-SSRS RISK CATEGORY Low Risk High Risk No Risk        Assessment and Plan:   Walter Howe is a 35 year old male with a past psychiatric history significant for PTSD, schizoaffective disorder (bipolar type), generalized anxiety disorder, insomnia, and panic disorder who presents to [facility] via virtual telephone visit for follow-up and medication management.  Patient endorses depressive symptoms, worsening anxiety, and an increase in frequency of his panic attacks.  Due to patient being unsuccessful with the management of his anxiety through the use of a number of anxiolytics, provider to place patient on Klonopin 0.5 mg 2 times daily as needed for the management of his anxiety and panic attacks.  Patient was agreeable to recommendation.  Patient's medications to be e-prescribed to pharmacy of choice.  1. Generalized anxiety disorder  - FLUoxetine (PROZAC) 40 MG capsule; Take 2 capsules (80 mg total) by mouth daily.  Dispense: 60 capsule; Refill: 1 - clonazePAM (KLONOPIN) 0.5 MG tablet; Take 1 tablet (0.5 mg total) by mouth 2 (two) times daily as needed for anxiety.  Dispense: 60 tablet; Refill: 0  2. Schizoaffective disorder, bipolar type (HCC)  - FLUoxetine (PROZAC) 40 MG capsule; Take 2 capsules (80 mg total) by mouth daily.  Dispense: 60 capsule; Refill: 1 - QUEtiapine  (SEROQUEL) 50 MG tablet; Take 1 tablet (50 mg total) by mouth at bedtime.  Dispense: 30 tablet; Refill: 1 - divalproex (DEPAKOTE) 250 MG DR tablet; Take 1 tablet (250 mg total) by mouth 3 (three) times daily.  Dispense: 90 tablet; Refill: 2  3. Posttraumatic stress disorder  - FLUoxetine (PROZAC) 40 MG capsule; Take 2 capsules (80 mg total) by mouth daily.  Dispense: 60 capsule; Refill: 1  4. Insomnia, unspecified type  - QUEtiapine (SEROQUEL) 50 MG tablet; Take 1 tablet (50 mg total) by mouth at bedtime.  Dispense: 30 tablet; Refill: 1  5. Panic disorder  - clonazePAM (KLONOPIN) 0.5 MG tablet; Take 1 tablet (0.5 mg total) by mouth 2 (two) times daily as needed for anxiety.  Dispense: 60 tablet; Refill: 0  Patient to follow up in 6 weeks Provider spent a total of 20 minutes with the patient/reviewing patient's chart  Meta Hatchet, PA 11/19/2020, 9:33 PM

## 2020-12-22 ENCOUNTER — Telehealth (HOSPITAL_COMMUNITY): Payer: Self-pay | Admitting: *Deleted

## 2020-12-22 NOTE — Telephone Encounter (Signed)
Rx Refill Request clonazePAM (KLONOPIN) 0.5 MG tablet

## 2020-12-22 NOTE — Telephone Encounter (Signed)
Call from patient again today requesting a new rx be called to preferred pharmacy for Klonopin. Link Snuffer is his provider and he is out of the office all week. Will ask NP covering for him to call it in, which is Hillery Jacks.

## 2020-12-23 ENCOUNTER — Other Ambulatory Visit (HOSPITAL_COMMUNITY): Payer: Self-pay | Admitting: *Deleted

## 2020-12-23 DIAGNOSIS — F411 Generalized anxiety disorder: Secondary | ICD-10-CM

## 2020-12-23 DIAGNOSIS — F410 Panic disorder [episodic paroxysmal anxiety] without agoraphobia: Secondary | ICD-10-CM

## 2020-12-23 MED ORDER — CLONAZEPAM 0.5 MG PO TABS: 0.5000 mg | ORAL_TABLET | Freq: Two times a day (BID) | ORAL | 0 refills | Status: DC | PRN

## 2020-12-23 NOTE — Progress Notes (Signed)
Spoke with Hillery Jacks NP re patients request for Klonopin to be called in, he is out and should be out. Tanika approved this rx and Clinical research associate called it in to [facility].

## 2020-12-24 NOTE — Telephone Encounter (Signed)
Provider was contacted by Rushie Chestnut, RMA regarding medication refill. Message acknowledged and reviewed. Patient's medications were sent out by Alcario Drought, NP

## 2021-01-01 ENCOUNTER — Encounter (HOSPITAL_COMMUNITY): Payer: Self-pay

## 2021-01-04 ENCOUNTER — Encounter (HOSPITAL_COMMUNITY): Payer: Self-pay | Admitting: Physician Assistant

## 2021-01-04 ENCOUNTER — Telehealth (INDEPENDENT_AMBULATORY_CARE_PROVIDER_SITE_OTHER): Payer: No Payment, Other | Admitting: Physician Assistant

## 2021-01-04 ENCOUNTER — Other Ambulatory Visit: Payer: Self-pay

## 2021-01-04 DIAGNOSIS — F411 Generalized anxiety disorder: Secondary | ICD-10-CM | POA: Diagnosis not present

## 2021-01-04 DIAGNOSIS — F250 Schizoaffective disorder, bipolar type: Secondary | ICD-10-CM

## 2021-01-04 DIAGNOSIS — F4310 Post-traumatic stress disorder, unspecified: Secondary | ICD-10-CM

## 2021-01-04 DIAGNOSIS — G4700 Insomnia, unspecified: Secondary | ICD-10-CM

## 2021-01-04 DIAGNOSIS — F410 Panic disorder [episodic paroxysmal anxiety] without agoraphobia: Secondary | ICD-10-CM

## 2021-01-04 NOTE — Progress Notes (Signed)
BH MD/PA/NP OP Progress Note  Virtual Visit via Telephone Note  I connected with Walter Howe on 01/04/21 at 11:30 AM EDT by telephone and verified that I am speaking with the correct person using two identifiers.  Location: Patient: Home Provider: Clinic   I discussed the limitations, risks, security and privacy concerns of performing an evaluation and management service by telephone and the availability of in person appointments. I also discussed with the patient that there may be a patient responsible charge related to this service. The patient expressed understanding and agreed to proceed.  Follow Up Instructions:   I discussed the assessment and treatment plan with the patient. The patient was provided an opportunity to ask questions and all were answered. The patient agreed with the plan and demonstrated an understanding of the instructions.   The patient was advised to call back or seek an in-person evaluation if the symptoms worsen or if the condition fails to improve as anticipated.  I provided 25 minutes of non-face-to-face time during this encounter.  Meta Hatchet, PA    01/04/2021 6:28 PM Walter Howe  MRN:  [id number]  Chief Complaint: Follow up and medication management  HPI:     Visit Diagnosis:    ICD-10-CM   1. Generalized anxiety disorder  F41.1     2. Schizoaffective disorder, bipolar type (HCC)  F25.0     3. Posttraumatic stress disorder  F43.10     4. Insomnia, unspecified type  G47.00     5. Panic disorder  F41.0       Past Psychiatric History:  Schizoaffective disorder, bipolar type PTSD Major depressive disorder Generalized anxiety disorder Insomnia  Past Medical History:  Past Medical History:  Diagnosis Date   Anxiety    03/05/2019- in the past, not current   History of kidney stones    passed    Past Surgical History:  Procedure Laterality Date   NO PAST SURGERIES     ORIF HUMERUS FRACTURE Right 03/07/2019   Procedure:  OPEN REDUCTION INTERNAL FIXATION (ORIF) RIGHT HUMERUS;  Surgeon: Tarry Kos, MD;  Location: [facility] OR;  Service: Orthopedics;  Laterality: Right;    Family Psychiatric History:  Unknown  Family History: History reviewed. No pertinent family history.  Social History:  Social History   Socioeconomic History   Marital status: Single    Spouse name: Not on file   Number of children: Not on file   Years of education: Not on file   Highest education level: Not on file  Occupational History   Not on file  Tobacco Use   Smoking status: Former    Types: Cigarettes    Quit date: 02/20/2019    Years since quitting: 1.8   Smokeless tobacco: Never  Vaping Use   Vaping Use: Every day   Devices: used i n attempt to stop smoking- did n ot work , quit vaping- 2019  Substance and Sexual Activity   Alcohol use: Yes    Comment: ocassional   Drug use: Yes    Comment: vicodin   Sexual activity: Not on file  Other Topics Concern   Not on file  Social History Narrative   Not on file   Social Determinants of Health   Financial Resource Strain: Not on file  Food Insecurity: Not on file  Transportation Needs: Not on file  Physical Activity: Not on file  Stress: Not on file  Social Connections: Not on file    Allergies: No Known  Allergies  Metabolic Disorder Labs: Lab Results  Component Value Date   HGBA1C 4.7 (L) 12/19/2019   MPG 88.19 12/19/2019   MPG 93.93 05/27/2019   No results found for: PROLACTIN Lab Results  Component Value Date   CHOL 241 (H) 12/19/2019   TRIG 107 12/19/2019   HDL 74 12/19/2019   CHOLHDL 3.3 12/19/2019   VLDL 21 12/19/2019   LDLCALC 146 (H) 12/19/2019   LDLCALC UNABLE TO CALCULATE IF TRIGLYCERIDE OVER 400 mg/dL 11/91/4782   Lab Results  Component Value Date   TSH 4.728 (H) 12/19/2019   TSH 2.742 05/27/2019    Therapeutic Level Labs: No results found for: LITHIUM Lab Results  Component Value Date   VALPROATE <10 (L) 12/19/2019   No  components found for:  CBMZ  Current Medications: Current Outpatient Medications  Medication Sig Dispense Refill   busPIRone (BUSPAR) 15 MG tablet Take 1 tablet (15 mg total) by mouth 3 (three) times daily. 90 tablet 1   clonazePAM (KLONOPIN) 0.5 MG tablet Take 1 tablet (0.5 mg total) by mouth 2 (two) times daily as needed for anxiety. 60 tablet 0   divalproex (DEPAKOTE) 250 MG DR tablet Take 1 tablet (250 mg total) by mouth 3 (three) times daily. 90 tablet 2   FLUoxetine (PROZAC) 40 MG capsule Take 2 capsules (80 mg total) by mouth daily. 60 capsule 1   hydrOXYzine (ATARAX/VISTARIL) 25 MG tablet Take 1 tablet (25 mg total) by mouth 3 (three) times daily as needed for anxiety. (Patient not taking: Reported on 12/19/2019) 75 tablet 0   nicotine polacrilex (NICORETTE) 2 MG gum Take 1 each (2 mg total) by mouth as needed. (May buy from over the counter): For smoking cessation (Patient not taking: Reported on 12/19/2019) 100 tablet 0   omega-3 acid ethyl esters (LOVAZA) 1 g capsule Take 1 capsule (1 g total) by mouth 2 (two) times daily. For high cholesterol (Patient not taking: Reported on 12/19/2019) 30 capsule 0   Prenatal Vit-Fe Fumarate-FA (PRENATAL MULTIVITAMIN) TABS tablet Take 1 tablet by mouth daily at 12 noon. (May buy from over the counter): Vitamin supplementation (Patient not taking: Reported on 12/19/2019)     QUEtiapine (SEROQUEL) 50 MG tablet Take 1 tablet (50 mg total) by mouth at bedtime. 30 tablet 1   No current facility-administered medications for this visit.     Musculoskeletal: Strength & Muscle Tone: Unable to assess due to telemedicine visit Gait & Station: Unable to assess due to telemedicine visit Patient leans: Unable to assess due to telemedicine visit  Psychiatric Specialty Exam: Review of Systems  Psychiatric/Behavioral:  Positive for decreased concentration and sleep disturbance. Negative for dysphoric mood, hallucinations, self-injury and suicidal ideas. The patient  is nervous/anxious. The patient is not hyperactive.    There were no vitals taken for this visit.There is no height or weight on file to calculate BMI.  General Appearance: Unable to assess due to telemedicine visit  Eye Contact:  Unable to assess due to telemedicine visit  Speech:  Clear and Coherent and Normal Rate  Volume:  Normal  Mood:  Anxious, Depressed, and Irritable  Affect:  Congruent and Depressed  Thought Process:  Coherent and Descriptions of Associations: Intact  Orientation:  Full (Time, Place, and Person)  Thought Content: WDL   Suicidal Thoughts:  No  Homicidal Thoughts:  No  Memory:  Immediate;   Good Recent;   Good Remote;   Good  Judgement:  Fair  Insight:  Fair  Psychomotor Activity:  Restlessness  Concentration:  Concentration: Good and Attention Span: Good  Recall:  Fair  Fund of Knowledge: Good  Language: Good  Akathisia:  NA  Handed:  Right  AIMS (if indicated): not done  Assets:  Communication Skills Desire for Improvement Housing  ADL's:  Intact  Cognition: WNL  Sleep:  Fair   Screenings: AIMS    Flowsheet Row Admission (Discharged) [facility]  AIMS Total Score 0      AUDIT    Flowsheet Row Admission (Discharged) from [facility]  Alcohol Use Disorder Identification Test Final Score (AUDIT) 2      GAD-7    Flowsheet Row Video Visit from 01/04/2021 in [facility] Video Visit from 11/19/2020 in [facility] Video Visit from 10/07/2020 in [facility] Video Visit from 09/01/2020 in [facility] Video Visit from 07/23/2020 in [facility]  Total GAD-7 Score 7 18 19 18 21       PHQ2-9    Flowsheet Row Video Visit from 01/04/2021 in [facility] Video Visit  from 11/19/2020 in [facility] Video Visit from 10/07/2020 in [facility] Video Visit from 07/23/2020 in [facility]  PHQ-2 Total Score 2 6 5 2   PHQ-9 Total Score 13 20 17 10       Flowsheet Row [facility] 01/04/2021 [facility] Video Visit from 11/19/2020 in [facility] Video Visit from 10/07/2020 in [facility]  C-SSRS RISK CATEGORY No Risk Low Risk High Risk        Assessment and Plan:     1. Generalized anxiety disorder   2. Schizoaffective disorder, bipolar type (HCC)   3. Posttraumatic stress disorder   4. Insomnia, unspecified type   5. Panic disorder   Patient to follow up in 2 months Writer spent a total of 25 minutes with the patient/reviewing patient's chart  [facility], PA 01/04/2021, 6:28 PM

## 2021-01-06 ENCOUNTER — Telehealth (HOSPITAL_COMMUNITY): Payer: No Payment, Other | Admitting: Physician Assistant

## 2021-01-26 ENCOUNTER — Other Ambulatory Visit (HOSPITAL_COMMUNITY): Payer: Self-pay | Admitting: Family

## 2021-01-26 ENCOUNTER — Other Ambulatory Visit (HOSPITAL_COMMUNITY): Payer: Self-pay | Admitting: Physician Assistant

## 2021-01-26 DIAGNOSIS — F250 Schizoaffective disorder, bipolar type: Secondary | ICD-10-CM

## 2021-01-26 DIAGNOSIS — F411 Generalized anxiety disorder: Secondary | ICD-10-CM

## 2021-01-26 DIAGNOSIS — F410 Panic disorder [episodic paroxysmal anxiety] without agoraphobia: Secondary | ICD-10-CM

## 2021-01-26 DIAGNOSIS — G4700 Insomnia, unspecified: Secondary | ICD-10-CM

## 2021-01-28 ENCOUNTER — Other Ambulatory Visit (HOSPITAL_COMMUNITY): Payer: Self-pay | Admitting: Physician Assistant

## 2021-01-28 ENCOUNTER — Telehealth (HOSPITAL_COMMUNITY): Payer: Self-pay | Admitting: *Deleted

## 2021-01-28 DIAGNOSIS — F411 Generalized anxiety disorder: Secondary | ICD-10-CM

## 2021-01-28 DIAGNOSIS — F410 Panic disorder [episodic paroxysmal anxiety] without agoraphobia: Secondary | ICD-10-CM

## 2021-01-28 MED ORDER — CLONAZEPAM 0.5 MG PO TABS: 0.5000 mg | ORAL_TABLET | Freq: Two times a day (BID) | ORAL | 0 refills | Status: DC | PRN

## 2021-01-28 NOTE — Progress Notes (Signed)
Provider was contacted by Suzanne K. Beck, RN regarding medication refill for this patient. Patient's medication to be e-prescribed to pharmacy of choice.

## 2021-01-28 NOTE — Telephone Encounter (Signed)
Call from [place] at [place] seeking a rx to be esigned in for patients Klonopin. Will forward request to his provider, Otila Back PA.

## 2021-01-28 NOTE — Telephone Encounter (Signed)
Provider was contacted by Suzanne K Beck, RN regarding medication refill. Patient's medication to be e-prescribed to pharmacy of choice. 

## 2021-02-25 ENCOUNTER — Other Ambulatory Visit (HOSPITAL_COMMUNITY): Payer: Self-pay | Admitting: Physician Assistant

## 2021-02-25 DIAGNOSIS — F411 Generalized anxiety disorder: Secondary | ICD-10-CM

## 2021-02-25 DIAGNOSIS — F410 Panic disorder [episodic paroxysmal anxiety] without agoraphobia: Secondary | ICD-10-CM

## 2021-03-05 ENCOUNTER — Telehealth (HOSPITAL_COMMUNITY): Payer: Self-pay | Admitting: Physician Assistant

## 2021-03-08 ENCOUNTER — Telehealth (HOSPITAL_COMMUNITY): Payer: Self-pay | Admitting: Physician Assistant

## 2021-03-08 ENCOUNTER — Other Ambulatory Visit: Payer: Self-pay

## 2021-04-01 ENCOUNTER — Other Ambulatory Visit (HOSPITAL_COMMUNITY): Payer: Self-pay | Admitting: Physician Assistant

## 2021-04-01 DIAGNOSIS — F411 Generalized anxiety disorder: Secondary | ICD-10-CM

## 2021-04-01 DIAGNOSIS — G4700 Insomnia, unspecified: Secondary | ICD-10-CM

## 2021-04-01 DIAGNOSIS — F410 Panic disorder [episodic paroxysmal anxiety] without agoraphobia: Secondary | ICD-10-CM

## 2021-04-01 DIAGNOSIS — F250 Schizoaffective disorder, bipolar type: Secondary | ICD-10-CM

## 2021-04-21 ENCOUNTER — Telehealth (HOSPITAL_COMMUNITY): Payer: Self-pay | Admitting: Physician Assistant

## 2021-05-04 ENCOUNTER — Other Ambulatory Visit (HOSPITAL_COMMUNITY): Payer: Self-pay | Admitting: Physician Assistant

## 2021-05-04 DIAGNOSIS — F411 Generalized anxiety disorder: Secondary | ICD-10-CM

## 2021-05-04 DIAGNOSIS — F410 Panic disorder [episodic paroxysmal anxiety] without agoraphobia: Secondary | ICD-10-CM

## 2021-05-26 ENCOUNTER — Telehealth (HOSPITAL_COMMUNITY): Payer: No Payment, Other | Admitting: Physician Assistant

## 2021-06-07 NOTE — Telephone Encounter (Signed)
Patient must have follow up appointment to receive refill on their medication.

## 2021-06-18 NOTE — Telephone Encounter (Signed)
Attempted to contacted patient twice to reschedule [1/19 & 1/27]. Unsuccessful, no voicemail box set up.

## 2021-06-30 ENCOUNTER — Encounter (HOSPITAL_COMMUNITY): Payer: Self-pay

## 2021-06-30 NOTE — Telephone Encounter (Signed)
Writer received follow-up notice from pharmacy for medication refill. Patient has been called multiple times, with unsuccessful attempts. Medication will not be refilled until patient has another visit with provider.

## 2021-07-09 NOTE — Telephone Encounter (Signed)
Attempted to reach pt to schedule follow-up appointment, however listed phone did not ring or provide voicemail option.  Patient cannot have refill until seen by provider.

## 2021-07-13 ENCOUNTER — Other Ambulatory Visit: Payer: Self-pay

## 2021-07-13 ENCOUNTER — Ambulatory Visit (INDEPENDENT_AMBULATORY_CARE_PROVIDER_SITE_OTHER): Payer: No Payment, Other | Admitting: Physician Assistant

## 2021-07-13 ENCOUNTER — Encounter (HOSPITAL_COMMUNITY): Payer: Self-pay | Admitting: Physician Assistant

## 2021-07-13 DIAGNOSIS — F410 Panic disorder [episodic paroxysmal anxiety] without agoraphobia: Secondary | ICD-10-CM | POA: Diagnosis not present

## 2021-07-13 DIAGNOSIS — F411 Generalized anxiety disorder: Secondary | ICD-10-CM

## 2021-07-13 DIAGNOSIS — F250 Schizoaffective disorder, bipolar type: Secondary | ICD-10-CM

## 2021-07-13 MED ORDER — CLONAZEPAM 0.5 MG PO TABS: 0.5000 mg | ORAL_TABLET | Freq: Two times a day (BID) | ORAL | 0 refills | Status: DC | PRN

## 2021-07-13 MED ORDER — DIVALPROEX SODIUM 250 MG PO DR TAB: 250.0000 mg | DELAYED_RELEASE_TABLET | Freq: Three times a day (TID) | ORAL | 2 refills | Status: DC

## 2021-07-13 MED ORDER — BUSPIRONE HCL 15 MG PO TABS: 15.0000 mg | ORAL_TABLET | Freq: Three times a day (TID) | ORAL | 1 refills | Status: DC

## 2021-07-13 MED ORDER — QUETIAPINE FUMARATE 50 MG PO TABS: 50.0000 mg | ORAL_TABLET | Freq: Every day | ORAL | 1 refills | Status: DC

## 2021-07-13 NOTE — Progress Notes (Signed)
BH MD/PA/NP OP Progress Note  07/13/2021 4:26 PM Walter Howe  MRN:  [id number]  Chief Complaint:  Chief Complaint  Patient presents with   Medication Management   HPI:   Walter Howe is a 36 year old male with a past psychiatric history significant for schizoaffective disorder (bipolar type), generalized anxiety disorder, insomnia, and major depressive disorder who presents to [facility] for follow-up and medication management.  During the last encounter, patient was placed on the following medications:  Fluoxetine 80 mg daily Clonazepam 0.5 mg 2 times daily as needed Seroquel 50 mg at bedtime Depakote 250 mg 3 times daily  Patient reports that he is continuing to take Seroquel and buspirone but denies taking fluoxetine and states that he no longer needs to take the medication.  Patient still experiences anxiety and rates his anxiety a 6 out of 10.  Patient states that he used to experience panic attacks every day but has now been experiencing roughly 1 panic attack per day.  Patient denies experiencing mood swings or depressive symptoms at this time.  Patient's stressors include her grandmother recently getting into a car accident and his father being recently diagnosed with cancer.  Patient is not currently seeing a counselor and would like to hold off from receiving therapy from our facility.  A GAD-7 screen was performed with the patient scoring a 12.  Patient is alert and oriented x4, calm, cooperative, and fully engaged in conversation during the encounter.  Patient endorses good mood.  Patient denies suicidal or homicidal ideations.  Patient further denies auditory or visual hallucinations and does not appear to be responding to internal/external stimuli.  Patient endorses good sleep and receives on average 8 hours of sleep each night.  Patient endorses good appetite and eats on average 6 small meals per day.  Patient denies alcohol  consumption, tobacco use, and illicit drug use.  Visit Diagnosis:    ICD-10-CM   1. Schizoaffective disorder, bipolar type (HCC)  F25.0 QUEtiapine (SEROQUEL) 50 MG tablet    divalproex (DEPAKOTE) 250 MG DR tablet    2. Generalized anxiety disorder  F41.1 busPIRone (BUSPAR) 15 MG tablet    clonazePAM (KLONOPIN) 0.5 MG tablet    3. Panic disorder  F41.0 clonazePAM (KLONOPIN) 0.5 MG tablet      Past Psychiatric History:  Schizoaffective disorder, bipolar type PTSD Major depressive disorder Generalized anxiety disorder Insomnia  Past Medical History:  Past Medical History:  Diagnosis Date   Anxiety    03/05/2019- in the past, not current   History of kidney stones    passed    Past Surgical History:  Procedure Laterality Date   NO PAST SURGERIES     ORIF HUMERUS FRACTURE Right 03/07/2019   Procedure: OPEN REDUCTION INTERNAL FIXATION (ORIF) RIGHT HUMERUS;  Surgeon: Tarry Kos, MD;  Location: [facility] OR;  Service: Orthopedics;  Laterality: Right;    Family Psychiatric History:  Unknown  Family History: No family history on file.  Social History:  Social History   Socioeconomic History   Marital status: Single    Spouse name: Not on file   Number of children: Not on file   Years of education: Not on file   Highest education level: Not on file  Occupational History   Not on file  Tobacco Use   Smoking status: Former    Types: Cigarettes    Quit date: 02/20/2019    Years since quitting: 2.3   Smokeless tobacco: Never  Vaping Use   Vaping Use: Every day   Devices: used i n attempt to stop smoking- did n ot work , quit vaping- 2019  Substance and Sexual Activity   Alcohol use: Yes    Comment: ocassional   Drug use: Yes    Comment: vicodin   Sexual activity: Not on file  Other Topics Concern   Not on file  Social History Narrative   Not on file   Social Determinants of Health   Financial Resource Strain: Not on file  Food Insecurity: Not on file   Transportation Needs: Not on file  Physical Activity: Not on file  Stress: Not on file  Social Connections: Not on file    Allergies: No Known Allergies  Metabolic Disorder Labs: Lab Results  Component Value Date   HGBA1C 4.7 (L) 12/19/2019   MPG 88.19 12/19/2019   MPG 93.93 05/27/2019   No results found for: PROLACTIN Lab Results  Component Value Date   CHOL 241 (H) 12/19/2019   TRIG 107 12/19/2019   HDL 74 12/19/2019   CHOLHDL 3.3 12/19/2019   VLDL 21 12/19/2019   LDLCALC 146 (H) 12/19/2019   LDLCALC UNABLE TO CALCULATE IF TRIGLYCERIDE OVER 400 mg/dL 16/10/960401/08/2019   Lab Results  Component Value Date   TSH 4.728 (H) 12/19/2019   TSH 2.742 05/27/2019    Therapeutic Level Labs: No results found for: LITHIUM Lab Results  Component Value Date   VALPROATE <10 (L) 12/19/2019   No components found for:  CBMZ  Current Medications: Current Outpatient Medications  Medication Sig Dispense Refill   busPIRone (BUSPAR) 15 MG tablet Take 1 tablet (15 mg total) by mouth 3 (three) times daily. 90 tablet 1   clonazePAM (KLONOPIN) 0.5 MG tablet Take 1 tablet (0.5 mg total) by mouth 2 (two) times daily as needed for anxiety. 60 tablet 0   divalproex (DEPAKOTE) 250 MG DR tablet Take 1 tablet (250 mg total) by mouth 3 (three) times daily. 90 tablet 2   FLUoxetine (PROZAC) 40 MG capsule Take 2 capsules (80 mg total) by mouth daily. 60 capsule 1   hydrOXYzine (ATARAX/VISTARIL) 25 MG tablet Take 1 tablet (25 mg total) by mouth 3 (three) times daily as needed for anxiety. (Patient not taking: Reported on 12/19/2019) 75 tablet 0   nicotine polacrilex (NICORETTE) 2 MG gum Take 1 each (2 mg total) by mouth as needed. (May buy from over the counter): For smoking cessation (Patient not taking: Reported on 12/19/2019) 100 tablet 0   omega-3 acid ethyl esters (LOVAZA) 1 g capsule Take 1 capsule (1 g total) by mouth 2 (two) times daily. For high cholesterol (Patient not taking: Reported on 12/19/2019) 30  capsule 0   Prenatal Vit-Fe Fumarate-FA (PRENATAL MULTIVITAMIN) TABS tablet Take 1 tablet by mouth daily at 12 noon. (May buy from over the counter): Vitamin supplementation (Patient not taking: Reported on 12/19/2019)     QUEtiapine (SEROQUEL) 50 MG tablet Take 1 tablet (50 mg total) by mouth at bedtime. 30 tablet 1   No current facility-administered medications for this visit.     Musculoskeletal: Strength & Muscle Tone: within normal limits Gait & Station: normal Patient leans: N/A  Psychiatric Specialty Exam: Review of Systems  Psychiatric/Behavioral:  Negative for decreased concentration, dysphoric mood, hallucinations, self-injury, sleep disturbance and suicidal ideas. The patient is nervous/anxious. The patient is not hyperactive.    Blood pressure (!) 147/87, pulse (!) 115, height 5' 5.5" (1.664 m), weight 103 lb (46.7 kg), SpO2 100 %.  Body mass index is 16.88 kg/m.  General Appearance: Well Groomed  Eye Contact:  Good  Speech:  Clear and Coherent and Normal Rate  Volume:  Normal  Mood:  Anxious and Euthymic  Affect:  Appropriate and Congruent  Thought Process:  Coherent, Goal Directed, and Descriptions of Associations: Intact  Orientation:  Full (Time, Place, and Person)  Thought Content: WDL   Suicidal Thoughts:  No  Homicidal Thoughts:  No  Memory:  Immediate;   Good Recent;   Good Remote;   Good  Judgement:  Fair  Insight:  Fair  Psychomotor Activity:  Restlessness  Concentration:  Concentration: Good and Attention Span: Good  Recall:  Fair  Fund of Knowledge: Good  Language: Good  Akathisia:  No  Handed:  Right  AIMS (if indicated): not done  Assets:  Communication Skills Desire for Improvement Housing  ADL's:  Intact  Cognition: WNL  Sleep:  Good   Screenings: AIMS    Flowsheet Row Ad[facility] (Dis[facility]  AIMS Total Score 0      AUDIT    Flowsheet Row Admission  (Discharged) from [facility]  Alcohol Use Disorder Identification Test Final Score (AUDIT) 2      GAD-7    Flowsheet Row Clinical Support from 07/13/2021 in [facility] Video Visit from 01/04/2021 in [facility] Video Visit from 11/19/2020 in [facility] Video Visit from 10/07/2020 in [facility] Video Visit from 09/01/2020 in [facility]  Total GAD-7 Score 12 7 18 19 18       PHQ2-9    Flowsheet Row Clinical Support from 07/13/2021 in [facility] Video Visit from 01/04/2021 in [facility] Video Visit from 11/19/2020 in [facility] Video Visit from 10/07/2020 in [facility] Video Visit from 07/23/2020 in [facility]  PHQ-2 Total Score 1 2 6 5 2   PHQ-9 Total Score -- 13 20 17 10       Flowsheet Row Clinical Support from 07/13/2021 in [facility] Video Visit from 01/04/2021 in [facility] Video Visit from 11/19/2020 in [facility]  C-SSRS RISK CATEGORY Low Risk No Risk Low Risk        Assessment and Plan:   Walter Howe is a 36 year old male with a past psychiatric history significant for schizoaffective disorder (bipolar type), generalized anxiety disorder, insomnia, and major depressive disorder who presents to [facility] for follow-up and medication management.  Patient reports that he has continued to use the Seroquel and buspirone but has discontinued using fluoxetine.  Patient denies depressive symptoms but states that he still continues to experience anxiety.  Patient to continue taking his medications as prescribed.   Patient's medications to be e-prescribed to pharmacy of choice.  Collaboration of Care: Collaboration of Care: Medication Management AEB provider managing patient psychiatric medications and Psychiatrist AEB patient being seen by mental health provider  Patient/Guardian was advised Release of Information must be obtained prior to any record release in order to collaborate their care with an outside provider. Patient/Guardian was advised if they have not already done so to contact the registration department to sign all necessary forms in order for [place] to release information  regarding their care.   Consent: Patient/Guardian gives verbal consent for treatment and assignment of benefits for services provided during this visit. Patient/Guardian expressed understanding and agreed to proceed.   1. Schizoaffective disorder, bipolar type (HCC)  - QUEtiapine (SEROQUEL) 50 MG tablet; Take 1 tablet (50 mg total) by mouth at bedtime.  Dispense: 30 tablet; Refill: 1 - divalproex (DEPAKOTE) 250 MG DR tablet; Take 1 tablet (250 mg total) by mouth 3 (three) times daily.  Dispense: 90 tablet; Refill: 2  2. Generalized anxiety disorder  - busPIRone (BUSPAR) 15 MG tablet; Take 1 tablet (15 mg total) by mouth 3 (three) times daily.  Dispense: 90 tablet; Refill: 1 - clonazePAM (KLONOPIN) 0.5 MG tablet; Take 1 tablet (0.5 mg total) by mouth 2 (two) times daily as needed for anxiety.  Dispense: 60 tablet; Refill: 0  3. Panic disorder  - clonazePAM (KLONOPIN) 0.5 MG tablet; Take 1 tablet (0.5 mg total) by mouth 2 (two) times daily as needed for anxiety.  Dispense: 60 tablet; Refill: 0  Patient to follow up in 2 months Provider spent a total of 12 minutes with the patient/reviewing patient's chart  Meta Hatchet, PA 07/13/2021, 4:26 PM

## 2021-08-09 ENCOUNTER — Other Ambulatory Visit (HOSPITAL_COMMUNITY): Payer: Self-pay | Admitting: Physician Assistant

## 2021-08-09 DIAGNOSIS — F410 Panic disorder [episodic paroxysmal anxiety] without agoraphobia: Secondary | ICD-10-CM

## 2021-08-09 DIAGNOSIS — F411 Generalized anxiety disorder: Secondary | ICD-10-CM

## 2021-09-13 ENCOUNTER — Other Ambulatory Visit (HOSPITAL_COMMUNITY): Payer: Self-pay | Admitting: Physician Assistant

## 2021-09-13 DIAGNOSIS — F411 Generalized anxiety disorder: Secondary | ICD-10-CM

## 2021-09-13 DIAGNOSIS — F410 Panic disorder [episodic paroxysmal anxiety] without agoraphobia: Secondary | ICD-10-CM

## 2021-09-14 ENCOUNTER — Encounter (HOSPITAL_COMMUNITY): Payer: Self-pay | Admitting: Physician Assistant

## 2021-09-14 ENCOUNTER — Ambulatory Visit (INDEPENDENT_AMBULATORY_CARE_PROVIDER_SITE_OTHER): Payer: No Payment, Other | Admitting: Physician Assistant

## 2021-09-14 DIAGNOSIS — F410 Panic disorder [episodic paroxysmal anxiety] without agoraphobia: Secondary | ICD-10-CM

## 2021-09-14 DIAGNOSIS — F411 Generalized anxiety disorder: Secondary | ICD-10-CM

## 2021-09-14 DIAGNOSIS — F250 Schizoaffective disorder, bipolar type: Secondary | ICD-10-CM | POA: Diagnosis not present

## 2021-09-14 MED ORDER — CLONAZEPAM 0.5 MG PO TABS: ORAL_TABLET | ORAL | 0 refills | Status: DC

## 2021-09-14 MED ORDER — BUSPIRONE HCL 15 MG PO TABS: 15.0000 mg | ORAL_TABLET | Freq: Three times a day (TID) | ORAL | 3 refills | Status: DC

## 2021-09-14 MED ORDER — DIVALPROEX SODIUM 250 MG PO DR TAB: 250.0000 mg | DELAYED_RELEASE_TABLET | Freq: Three times a day (TID) | ORAL | 3 refills | Status: DC

## 2021-09-14 MED ORDER — QUETIAPINE FUMARATE 50 MG PO TABS: 50.0000 mg | ORAL_TABLET | Freq: Every day | ORAL | 3 refills | Status: DC

## 2021-09-14 NOTE — Progress Notes (Signed)
BH MD/PA/NP OP Progress Note ? ?09/14/2021 8:38 AM ?Walter Howe  ?MRN:  [id number] ? ?Chief Complaint:  ?Chief Complaint  ?Patient presents with  ? Medication Management  ?  in person, med mgmt  ? ?HPI:  ? ?Walter Howe is a 36 year old male with a past psychiatric history significant for schizoaffective disorder (bipolar type), generalized anxiety disorder, insomnia, and major depressive disorder who [facility] and medication management.  Patient is currently being managed on the following medications: ? ?Clonazepam 0.5 mg 2 times daily as needed ?Seroquel 50 mg at bedtime ?Depakote 250 mg 3 times daily ? ?Patient ports no issues or concerns regarding his current medication regimen.  Patient denies the need for dosage adjustments at this time and is requesting refills on all of his medications following the conclusion of the encounter.  Patient reports that nothing has changed in regards to his mental health since the last encounter.  Patient denies depressive symptoms and states that his anxiety is fairly manageable.  Currently, patient rates his anxiety at 3 out of 10 while on average, he rates his anxiety at 5 out of 10.  Patient denies any new stressors at this time. ? ?Patient is alert and oriented x4, pleasant, calm, cooperative, and fully engaged in conversation during the encounter.  Patient endorses good mood.  Patient denies suicidal or homicidal ideations.  Patient further denies auditory or visual hallucinations and does not appear to be responding to internal/external stimuli.  Patient endorses good sleep and receives on average 8 hours of sleep each night.  Patient endorses good appetite and eats on average 3 meals a day.  Patient reports that he is trying to limit eating throughout the day.  Patient denies alcohol consumption, tobacco use, and illicit drug use. ? ?Visit Diagnosis:  ?  ICD-10-CM   ?1. Schizoaffective disorder,  bipolar type (HCC)  F25.0 divalproex (DEPAKOTE) 250 MG DR tablet  ?  QUEtiapine (SEROQUEL) 50 MG tablet  ?  ?2. Generalized anxiety disorder  F41.1 busPIRone (BUSPAR) 15 MG tablet  ?  clonazePAM (KLONOPIN) 0.5 MG tablet  ?  ?3. Panic disorder  F41.0 clonazePAM (KLONOPIN) 0.5 MG tablet  ?  ? ? ?Past Psychiatric History:  ?Schizoaffective disorder, bipolar type ?PTSD ?Major depressive disorder ?Generalized anxiety disorder ?Insomnia ? ?Past Medical History:  ?Past Medical History:  ?Diagnosis Date  ? Anxiety   ? 03/05/2019- in the past, not current  ? History of kidney stones   ? passed  ?  ?Past Surgical History:  ?Procedure Laterality Date  ? NO PAST SURGERIES    ? ORIF HUMERUS FRACTURE Right 03/07/2019  ? Procedure: OPEN REDUCTION INTERNAL FIXATION (ORIF) RIGHT HUMERUS;  Surgeon: Tarry KosXu, Naiping M, MD;  Location: [facility] OR;  Service: Orthopedics;  Laterality: Right;  ? ? ?Family Psychiatric History:  ?Unknown ? ?Family History: History reviewed. No pertinent family history. ? ?Social History:  ?Social History  ? ?Socioeconomic History  ? Marital status: Single  ?  Spouse name: Not on file  ? Number of children: Not on file  ? Years of education: Not on file  ? Highest education level: Not on file  ?Occupational History  ? Not on file  ?Tobacco Use  ? Smoking status: Former  ?  Types: Cigarettes  ?  Quit date: 02/20/2019  ?  Years since quitting: 2.5  ? Smokeless tobacco: Never  ?Vaping Use  ? Vaping Use: Every day  ? Devices: used i  n attempt to stop smoking- did n ot work , quit vaping- 2019  ?Substance and Sexual Activity  ? Alcohol use: Yes  ?  Comment: ocassional  ? Drug use: Yes  ?  Comment: vicodin  ? Sexual activity: Not on file  ?Other Topics Concern  ? Not on file  ?Social History Narrative  ? Not on file  ? ?Social Determinants of Health  ? ?Financial Resource Strain: Not on file  ?Food Insecurity: Not on file  ?Transportation Needs: Not on file  ?Physical Activity: Not on file  ?Stress: Not on file  ?Social  Connections: Not on file  ? ? ?Allergies: No Known Allergies ? ?Metabolic Disorder Labs: ?Lab Results  ?Component Value Date  ? HGBA1C 4.7 (L) 12/19/2019  ? MPG 88.19 12/19/2019  ? MPG 93.93 05/27/2019  ? ?No results found for: PROLACTIN ?Lab Results  ?Component Value Date  ? CHOL 241 (H) 12/19/2019  ? TRIG 107 12/19/2019  ? HDL 74 12/19/2019  ? CHOLHDL 3.3 12/19/2019  ? VLDL 21 12/19/2019  ? LDLCALC 146 (H) 12/19/2019  ? LDLCALC UNABLE TO CALCULATE IF TRIGLYCERIDE OVER 400 mg/dL 89/38/1017  ? ?Lab Results  ?Component Value Date  ? TSH 4.728 (H) 12/19/2019  ? TSH 2.742 05/27/2019  ? ? ?Therapeutic Level Labs: ?No results found for: LITHIUM ?Lab Results  ?Component Value Date  ? VALPROATE <10 (L) 12/19/2019  ? ?No components found for:  CBMZ ? ?Current Medications: ?Current Outpatient Medications  ?Medication Sig Dispense Refill  ? busPIRone (BUSPAR) 15 MG tablet Take 1 tablet (15 mg total) by mouth 3 (three) times daily. 90 tablet 3  ? clonazePAM (KLONOPIN) 0.5 MG tablet TAKE 1 TABLET BY MOUTH TWICE A DAY AS NEEDED FOR ANXIETY 60 tablet 0  ? divalproex (DEPAKOTE) 250 MG DR tablet Take 1 tablet (250 mg total) by mouth 3 (three) times daily. 90 tablet 3  ? FLUoxetine (PROZAC) 40 MG capsule Take 2 capsules (80 mg total) by mouth daily. 60 capsule 1  ? hydrOXYzine (ATARAX/VISTARIL) 25 MG tablet Take 1 tablet (25 mg total) by mouth 3 (three) times daily as needed for anxiety. 75 tablet 0  ? nicotine polacrilex (NICORETTE) 2 MG gum Take 1 each (2 mg total) by mouth as needed. (May buy from over the counter): For smoking cessation 100 tablet 0  ? omega-3 acid ethyl esters (LOVAZA) 1 g capsule Take 1 capsule (1 g total) by mouth 2 (two) times daily. For high cholesterol 30 capsule 0  ? Prenatal Vit-Fe Fumarate-FA (PRENATAL MULTIVITAMIN) TABS tablet Take 1 tablet by mouth daily at 12 noon. (May buy from over the counter): Vitamin supplementation    ? QUEtiapine (SEROQUEL) 50 MG tablet Take 1 tablet (50 mg total) by mouth at  bedtime. 30 tablet 3  ? ?No current facility-administered medications for this visit.  ? ? ? ?Musculoskeletal: ?Strength & Muscle Tone: within normal limits ?Gait & Station: normal ?Patient leans: N/A ? ?Psychiatric Specialty Exam: ?Review of Systems  ?Psychiatric/Behavioral:  Negative for decreased concentration, dysphoric mood, hallucinations, self-injury, sleep disturbance and suicidal ideas. The patient is nervous/anxious. The patient is not hyperactive.    ?Blood pressure 119/78, pulse 98, height 5' 5.5" (1.664 m), weight 100 lb (45.4 kg).Body mass index is 16.39 kg/m?.  ?General Appearance: Well Groomed  ?Eye Contact:  Good  ?Speech:  Clear and Coherent and Normal Rate  ?Volume:  Normal  ?Mood:  Anxious and Euthymic  ?Affect:  Appropriate and Congruent  ?Thought Process:  Coherent,  Goal Directed, and Descriptions of Associations: Intact  ?Orientation:  Full (Time, Place, and Person)  ?Thought Content: WDL   ?Suicidal Thoughts:  No  ?Homicidal Thoughts:  No  ?Memory:  Immediate;   Good ?Recent;   Good ?Remote;   Good  ?Judgement:  Fair  ?Insight:  Fair  ?Psychomotor Activity:  Normal  ?Concentration:  Concentration: Good and Attention Span: Good  ?Recall:  Fair  ?Fund of Knowledge: Good  ?Language: Good  ?Akathisia:  No  ?Handed:  Right  ?AIMS (if indicated): not done  ?Assets:  Communication Skills ?Desire for Improvement ?Housing  ?ADL's:  Intact  ?Cognition: WNL  ?Sleep:  Good  ? ?Screenings: ?AIMS   ? ?Flowsheet Row Admission (Discharged) [facility]  ?AIMS Total Score 0  ? ?  ? ?AUDIT   ? ?Flowsheet Row Admission (Discharged) from [facility]  ?Alcohol Use Disorder Identification Test Final Score (AUDIT) 2  ? ?  ? ?GAD-7   ? ?Flowsheet Row Clinical Support from 09/14/2021 in [facility] Office Visit from 07/13/2021 in [facility] Video Visit from 01/04/2021 in [facility] Video Visit from 11/19/2020 in [facility] Video Visit from 10/07/2020 in [facility]

## 2021-10-11 ENCOUNTER — Other Ambulatory Visit (HOSPITAL_COMMUNITY): Payer: Self-pay | Admitting: Physician Assistant

## 2021-10-11 DIAGNOSIS — F411 Generalized anxiety disorder: Secondary | ICD-10-CM

## 2021-10-11 DIAGNOSIS — F410 Panic disorder [episodic paroxysmal anxiety] without agoraphobia: Secondary | ICD-10-CM

## 2021-12-08 ENCOUNTER — Other Ambulatory Visit (HOSPITAL_COMMUNITY): Payer: Self-pay | Admitting: Physician Assistant

## 2021-12-08 DIAGNOSIS — F411 Generalized anxiety disorder: Secondary | ICD-10-CM

## 2021-12-08 DIAGNOSIS — F410 Panic disorder [episodic paroxysmal anxiety] without agoraphobia: Secondary | ICD-10-CM

## 2021-12-10 ENCOUNTER — Other Ambulatory Visit (HOSPITAL_COMMUNITY): Payer: Self-pay | Admitting: Student in an Organized Health Care Education/Training Program

## 2021-12-10 ENCOUNTER — Telehealth (HOSPITAL_COMMUNITY): Payer: Self-pay | Admitting: *Deleted

## 2021-12-10 DIAGNOSIS — F410 Panic disorder [episodic paroxysmal anxiety] without agoraphobia: Secondary | ICD-10-CM

## 2021-12-10 DIAGNOSIS — F411 Generalized anxiety disorder: Secondary | ICD-10-CM

## 2021-12-10 MED ORDER — CLONAZEPAM 0.5 MG PO TABS: 0.5000 mg | ORAL_TABLET | Freq: Two times a day (BID) | ORAL | 0 refills | Status: DC | PRN

## 2021-12-10 NOTE — Telephone Encounter (Signed)
[place] sent over request for patients Klonopin to be renewed. He was last seen in late April and has a future appt on 12/14/21 but should be out tomorrow which is the weekend. Will forward this request to Dr Morrie Sheldon who is covering today for Eddie PA who is out of the office.

## 2021-12-14 ENCOUNTER — Ambulatory Visit (INDEPENDENT_AMBULATORY_CARE_PROVIDER_SITE_OTHER): Payer: No Payment, Other | Admitting: Student in an Organized Health Care Education/Training Program

## 2021-12-14 ENCOUNTER — Encounter (HOSPITAL_COMMUNITY): Payer: Self-pay | Admitting: Student in an Organized Health Care Education/Training Program

## 2021-12-14 ENCOUNTER — Encounter (HOSPITAL_COMMUNITY): Payer: No Payment, Other | Admitting: Physician Assistant

## 2021-12-14 VITALS — BP 108/68 | HR 67 | Ht 67.0 in | Wt 108.2 lb

## 2021-12-14 DIAGNOSIS — F410 Panic disorder [episodic paroxysmal anxiety] without agoraphobia: Secondary | ICD-10-CM

## 2021-12-14 DIAGNOSIS — F250 Schizoaffective disorder, bipolar type: Secondary | ICD-10-CM | POA: Diagnosis not present

## 2021-12-14 DIAGNOSIS — F411 Generalized anxiety disorder: Secondary | ICD-10-CM

## 2021-12-14 MED ORDER — DIVALPROEX SODIUM 250 MG PO DR TAB: 250.0000 mg | DELAYED_RELEASE_TABLET | Freq: Three times a day (TID) | ORAL | 3 refills | Status: DC

## 2021-12-14 MED ORDER — QUETIAPINE FUMARATE 50 MG PO TABS: 50.0000 mg | ORAL_TABLET | Freq: Every day | ORAL | 3 refills | Status: DC

## 2021-12-14 MED ORDER — CLONAZEPAM 0.5 MG PO TABS: 0.5000 mg | ORAL_TABLET | Freq: Two times a day (BID) | ORAL | 0 refills | Status: DC | PRN

## 2021-12-14 NOTE — Progress Notes (Signed)
BH MD/PA/NP OP Progress Note  12/14/2021 11:36 AM Walter Howe  MRN:  [id number]  Chief Complaint: No chief complaint on file.  HPI:  Walter Howe is a 36 year old male with a past psychiatric history significant for schizoaffective disorder (bipolar type), generalized anxiety disorder, insomnia, and major depressive disorder who presents to [facility] for follow-up and medication management.   He reports that he has been doing well on his medications.  He reports he has had no issues or side effects with them.  He reports his mood has been stable.  He reports the only issue has been him running out of his Klonopin approximately a week ago which did raise his anxiety and slightly worsen his sleep.  He reports otherwise that his sleep has been good.  He reports his appetite has been doing good.  He reports no SI, HI, or AVH.  Discussed we would not make any medication changes at this time.  Discussed with him what to do in the event of a future crisis.  Discussed that he can return to [facility], go to [facility], go to the nearest ED, or call 911 or [phone].   He reported understanding and had no concerns.    Visit Diagnosis:    ICD-10-CM   1. Schizoaffective disorder, bipolar type (HCC)  F25.0 QUEtiapine (SEROQUEL) 50 MG tablet    divalproex (DEPAKOTE) 250 MG DR tablet    2. Generalized anxiety disorder  F41.1 clonazePAM (KLONOPIN) 0.5 MG tablet    3. Panic disorder  F41.0 clonazePAM (KLONOPIN) 0.5 MG tablet      Past Psychiatric History:  Schizoaffective disorder, bipolar type PTSD Major depressive disorder Generalized anxiety disorder Insomnia  Past Medical History:  Past Medical History:  Diagnosis Date   Anxiety    03/05/2019- in the past, not current   History of kidney stones    passed    Past Surgical History:  Procedure Laterality Date   NO PAST SURGERIES     ORIF HUMERUS FRACTURE Right 03/07/2019   Procedure: OPEN REDUCTION INTERNAL  FIXATION (ORIF) RIGHT HUMERUS;  Surgeon: Tarry Kos, MD;  Location: [facility] OR;  Service: Orthopedics;  Laterality: Right;    Family Psychiatric History: Unknown  Family History: History reviewed. No pertinent family history.  Social History:  Social History   Socioeconomic History   Marital status: Single    Spouse name: Not on file   Number of children: Not on file   Years of education: Not on file   Highest education level: Not on file  Occupational History   Not on file  Tobacco Use   Smoking status: Former    Types: Cigarettes    Quit date: 02/20/2019    Years since quitting: 2.8   Smokeless tobacco: Never  Vaping Use   Vaping Use: Every day   Devices: used i n attempt to stop smoking- did n ot work , quit vaping- 2019  Substance and Sexual Activity   Alcohol use: Yes    Comment: ocassional   Drug use: Yes    Comment: vicodin   Sexual activity: Not on file  Other Topics Concern   Not on file  Social History Narrative   Not on file   Social Determinants of Health   Financial Resource Strain: Not on file  Food Insecurity: Not on file  Transportation Needs: Not on file  Physical Activity: Not on file  Stress: Not on file  Social Connections: Not on file  Allergies: No Known Allergies  Metabolic Disorder Labs: Lab Results  Component Value Date   HGBA1C 4.7 (L) 12/19/2019   MPG 88.19 12/19/2019   MPG 93.93 05/27/2019   No results found for: "PROLACTIN" Lab Results  Component Value Date   CHOL 241 (H) 12/19/2019   TRIG 107 12/19/2019   HDL 74 12/19/2019   CHOLHDL 3.3 12/19/2019   VLDL 21 12/19/2019   LDLCALC 146 (H) 12/19/2019   LDLCALC UNABLE TO CALCULATE IF TRIGLYCERIDE OVER 400 mg/dL 34/28/7681   Lab Results  Component Value Date   TSH 4.728 (H) 12/19/2019   TSH 2.742 05/27/2019    Therapeutic Level Labs: No results found for: "LITHIUM" Lab Results  Component Value Date   VALPROATE <10 (L) 12/19/2019   No results found for:  "CBMZ"  Current Medications: Current Outpatient Medications  Medication Sig Dispense Refill   clonazePAM (KLONOPIN) 0.5 MG tablet Take 1 tablet (0.5 mg total) by mouth 2 (two) times daily as needed for anxiety. 60 tablet 0   busPIRone (BUSPAR) 15 MG tablet Take 1 tablet (15 mg total) by mouth 3 (three) times daily. 90 tablet 3   divalproex (DEPAKOTE) 250 MG DR tablet Take 1 tablet (250 mg total) by mouth 3 (three) times daily. 90 tablet 3   FLUoxetine (PROZAC) 40 MG capsule Take 2 capsules (80 mg total) by mouth daily. 60 capsule 1   hydrOXYzine (ATARAX/VISTARIL) 25 MG tablet Take 1 tablet (25 mg total) by mouth 3 (three) times daily as needed for anxiety. 75 tablet 0   nicotine polacrilex (NICORETTE) 2 MG gum Take 1 each (2 mg total) by mouth as needed. (May buy from over the counter): For smoking cessation 100 tablet 0   omega-3 acid ethyl esters (LOVAZA) 1 g capsule Take 1 capsule (1 g total) by mouth 2 (two) times daily. For high cholesterol 30 capsule 0   Prenatal Vit-Fe Fumarate-FA (PRENATAL MULTIVITAMIN) TABS tablet Take 1 tablet by mouth daily at 12 noon. (May buy from over the counter): Vitamin supplementation     QUEtiapine (SEROQUEL) 50 MG tablet Take 1 tablet (50 mg total) by mouth at bedtime. 30 tablet 3   No current facility-administered medications for this visit.     Musculoskeletal: Strength & Muscle Tone: within normal limits Gait & Station: normal Patient leans: N/A  Psychiatric Specialty Exam: Review of Systems  Respiratory:  Negative for shortness of breath.   Cardiovascular:  Negative for chest pain.  Gastrointestinal:  Negative for abdominal pain, constipation, diarrhea, nausea and vomiting.  Neurological:  Negative for dizziness, weakness and headaches.  Psychiatric/Behavioral:  Negative for dysphoric mood, hallucinations, self-injury, sleep disturbance and suicidal ideas.     Blood pressure 108/68, pulse 67, height 5\' 7"  (1.702 m), weight 108 lb 3.2 oz (49.1  kg), SpO2 100 %.Body mass index is 16.95 kg/m.  General Appearance: Casual and Fairly Groomed  Eye Contact:  Good  Speech:  Clear and Coherent and Normal Rate  Volume:  Normal  Mood:   mildly anxious  Affect:  Appropriate and Congruent  Thought Process:  Coherent and Goal Directed  Orientation:  Full (Time, Place, and Person)  Thought Content: Logical   Suicidal Thoughts:  No  Homicidal Thoughts:  No  Memory:  Immediate;   Fair Recent;   Fair  Judgement:  Good  Insight:  Good  Psychomotor Activity:  Normal  Concentration:  Concentration: Good and Attention Span: Good  Recall:  Good  Fund of Knowledge: Good  Language: Good  Akathisia:  Negative  Handed:  Right  AIMS (if indicated): done AIMS= 0  Assets:  Communication Skills Desire for Improvement Housing Physical Health Resilience  ADL's:  Intact  Cognition: WNL  Sleep:  Good   Screenings: AIMS    Flowsheet Row [facility] (Dis[facility]  AIMS Total Score 0      AUDIT    Flowsheet Row Admission (Discharged) [facility]  Alcohol Use Disorder Identification Test Final Score (AUDIT) 2      GAD-7    Flowsheet Row Office Visit from 09/14/2021 in [facility] Office Visit from 07/13/2021 in [facility] Video Visit from 01/04/2021 in [facility] Video Visit from 11/19/2020 in [facility] Video Visit from 10/07/2020 in [facility]  Total GAD-7 Score 12 12 7 18 19       PHQ2-9    Flowsheet Row Office Visit from 09/14/2021 in [facility] Office Visit from 07/13/2021 in [facility] Video Visit from 01/04/2021 in [facility] Video Visit from 11/19/2020 in [facility] Video Visit from 10/07/2020 in [facility]  PHQ-2 Total Score 1 1 2 6 5   PHQ-9 Total Score -- -- 13 20 17       Flowsheet Row Office Visit from 09/14/2021 in [facility] Office Visit from 07/13/2021 in [facility] Video Visit from 01/04/2021 in [facility]  C-SSRS RISK CATEGORY No Risk Low Risk No Risk        Assessment and Plan:  Walter Howe is a 36 year old male with a past psychiatric history significant for schizoaffective disorder (bipolar type), generalized anxiety disorder, insomnia, and major depressive disorder who presents to [facility] for follow-up and medication management.   Arvis continues to do well with no issues with his medications.  We will not make any dose changes at this time.  We will send refills in for his medications.  He will return to the office in 3 months.   1. Schizoaffective disorder, bipolar type (HCC) - divalproex (DEPAKOTE) 250 MG DR tablet; Take 1 tablet (250 mg total) by mouth 3 (three) times daily.  Dispense: 90 tablet; Refill: 3 - QUEtiapine (SEROQUEL) 50 MG tablet; Take 1 tablet (50 mg total) by mouth at bedtime.  Dispense: 30 tablet; Refill: 3    2. Generalized anxiety disorder  - busPIRone (BUSPAR) 15 MG tablet; Take 1 tablet (15 mg total) by mouth 3 (three) times daily.  Dispense: 90 tablet; Refill: 3 - clonazePAM (KLONOPIN) 0.5 MG tablet; TAKE 1 TABLET BY MOUTH TWICE A DAY AS NEEDED FOR ANXIETY  Dispense: 60 tablet; Refill: 0    3. Panic disorder - clonazePAM (KLONOPIN) 0.5 MG tablet; TAKE 1 TABLET BY MOUTH TWICE A DAY AS NEEDED FOR ANXIETY  Dispense: 60 tablet; Refill: 0   Collaboration of Care:   Patient/Guardian was advised Release of Information must be obtained prior to any record release in order to collaborate their care with an outside provider.  Patient/Guardian was advised if they have not already done so to contact the registration department to sign all necessary forms in order for 31 to release information regarding their care.   Consent: Patient/Guardian gives verbal  consent for treatment and assignment of benefits for services provided during this visit. Patient/Guardian expressed understanding and agreed to proceed.    Lauro Franklin, MD 12/14/2021, 11:36 AM

## 2021-12-14 NOTE — Telephone Encounter (Signed)
Patient seen in clinic and a refill was already sent in.  This duplicate order was discontinued.   Arna Snipe MD Resident

## 2022-01-13 ENCOUNTER — Telehealth (HOSPITAL_COMMUNITY): Payer: Self-pay | Admitting: *Deleted

## 2022-01-13 ENCOUNTER — Other Ambulatory Visit (HOSPITAL_COMMUNITY): Payer: Self-pay | Admitting: Physician Assistant

## 2022-01-13 DIAGNOSIS — F410 Panic disorder [episodic paroxysmal anxiety] without agoraphobia: Secondary | ICD-10-CM

## 2022-01-13 DIAGNOSIS — F411 Generalized anxiety disorder: Secondary | ICD-10-CM

## 2022-01-13 MED ORDER — CLONAZEPAM 0.5 MG PO TABS: 0.5000 mg | ORAL_TABLET | Freq: Two times a day (BID) | ORAL | 1 refills | Status: DC | PRN

## 2022-01-13 NOTE — Telephone Encounter (Signed)
Pharmacy sent request for patients Klonopin to be sent in. He has a future appt with Eddie PA on 03/16/22 and should be out of his Klonopin today. WIll forward this request to patients provider to be renewed.

## 2022-01-13 NOTE — Telephone Encounter (Signed)
Writer was contacted by Suzanne K. Beck, RN regarding medication request from patient's pharmacy.  Patient's medication to be e-prescribed to pharmacy of choice. 

## 2022-01-13 NOTE — Progress Notes (Signed)
Writer was contacted by Orpah Clinton. Reola Calkins, RN regarding medication request from patient's pharmacy.  Patient's medication to be e-prescribed to pharmacy of choice.

## 2022-03-15 ENCOUNTER — Other Ambulatory Visit (HOSPITAL_COMMUNITY): Payer: Self-pay | Admitting: Physician Assistant

## 2022-03-15 DIAGNOSIS — F411 Generalized anxiety disorder: Secondary | ICD-10-CM

## 2022-03-15 DIAGNOSIS — F410 Panic disorder [episodic paroxysmal anxiety] without agoraphobia: Secondary | ICD-10-CM

## 2022-03-16 ENCOUNTER — Encounter (HOSPITAL_COMMUNITY): Payer: No Payment, Other | Admitting: Physician Assistant

## 2022-03-22 ENCOUNTER — Encounter (HOSPITAL_COMMUNITY): Payer: Self-pay

## 2022-04-01 ENCOUNTER — Other Ambulatory Visit (HOSPITAL_COMMUNITY): Payer: Self-pay | Admitting: Student in an Organized Health Care Education/Training Program

## 2022-04-01 ENCOUNTER — Encounter (HOSPITAL_COMMUNITY): Payer: No Payment, Other | Admitting: Student in an Organized Health Care Education/Training Program

## 2022-04-01 DIAGNOSIS — F250 Schizoaffective disorder, bipolar type: Secondary | ICD-10-CM

## 2022-04-01 DIAGNOSIS — F4310 Post-traumatic stress disorder, unspecified: Secondary | ICD-10-CM

## 2022-04-01 DIAGNOSIS — F411 Generalized anxiety disorder: Secondary | ICD-10-CM

## 2022-04-01 DIAGNOSIS — F410 Panic disorder [episodic paroxysmal anxiety] without agoraphobia: Secondary | ICD-10-CM

## 2022-04-01 MED ORDER — DIVALPROEX SODIUM 250 MG PO DR TAB: 250.0000 mg | DELAYED_RELEASE_TABLET | Freq: Three times a day (TID) | ORAL | 3 refills | Status: DC

## 2022-04-01 MED ORDER — QUETIAPINE FUMARATE 50 MG PO TABS: 50.0000 mg | ORAL_TABLET | Freq: Every day | ORAL | 3 refills | Status: DC

## 2022-04-01 MED ORDER — CLONAZEPAM 0.5 MG PO TABS: 0.5000 mg | ORAL_TABLET | Freq: Two times a day (BID) | ORAL | 1 refills | Status: DC | PRN

## 2022-04-01 NOTE — Progress Notes (Signed)
Received message that patient needed refill of his medications since patient's last appointment had to be rescheduled by provider.  These were sent in.    Sent: -Depakote 250 mg TID.  90 tablets with 3 refills. -Seroquel 50 mg QHS.  30 tablets with 3 refills. -Klonopin 0.5 mg BID PRN.  60 tablets with 1 refills.     Arna Snipe MD Resident

## 2022-05-12 ENCOUNTER — Ambulatory Visit (HOSPITAL_COMMUNITY)
Admission: EM | Admit: 2022-05-12 | Discharge: 2022-05-12 | Disposition: A | Discharge status: Other Acute Inpatient Hospital | Payer: No Payment, Other | Attending: Family | Admitting: Family

## 2022-05-12 ENCOUNTER — Emergency Department (HOSPITAL_COMMUNITY)
Admission: EM | Admit: 2022-05-12 | Discharge: 2022-05-13 | Disposition: A | Discharge status: Other Acute Inpatient Hospital | Payer: Federal, State, Local not specified - Other | Attending: Emergency Medicine | Admitting: Emergency Medicine

## 2022-05-12 ENCOUNTER — Other Ambulatory Visit: Payer: Self-pay

## 2022-05-12 DIAGNOSIS — F250 Schizoaffective disorder, bipolar type: Secondary | ICD-10-CM | POA: Insufficient documentation

## 2022-05-12 DIAGNOSIS — R000 Tachycardia, unspecified: Secondary | ICD-10-CM | POA: Insufficient documentation

## 2022-05-12 DIAGNOSIS — F4310 Post-traumatic stress disorder, unspecified: Secondary | ICD-10-CM | POA: Diagnosis not present

## 2022-05-12 DIAGNOSIS — E8809 Other disorders of plasma-protein metabolism, not elsewhere classified: Secondary | ICD-10-CM | POA: Insufficient documentation

## 2022-05-12 DIAGNOSIS — Z20822 Contact with and (suspected) exposure to covid-19: Secondary | ICD-10-CM | POA: Insufficient documentation

## 2022-05-12 DIAGNOSIS — Z87891 Personal history of nicotine dependence: Secondary | ICD-10-CM | POA: Insufficient documentation

## 2022-05-12 DIAGNOSIS — Z79899 Other long term (current) drug therapy: Secondary | ICD-10-CM | POA: Diagnosis not present

## 2022-05-12 DIAGNOSIS — Z91148 Patient's other noncompliance with medication regimen for other reason: Secondary | ICD-10-CM | POA: Diagnosis not present

## 2022-05-12 DIAGNOSIS — F411 Generalized anxiety disorder: Secondary | ICD-10-CM | POA: Insufficient documentation

## 2022-05-12 DIAGNOSIS — F1990 Other psychoactive substance use, unspecified, uncomplicated: Secondary | ICD-10-CM

## 2022-05-12 LAB — CBC WITH DIFFERENTIAL/PLATELET
Abs Immature Granulocytes: 0.02 10*3/uL (ref 0.00–0.07)
Basophils Absolute: 0 10*3/uL (ref 0.0–0.1)
Basophils Relative: 0 %
Eosinophils Absolute: 0 10*3/uL (ref 0.0–0.5)
Eosinophils Relative: 0 %
HCT: 44.9 % (ref 39.0–52.0)
Hemoglobin: 14.8 g/dL (ref 13.0–17.0)
Immature Granulocytes: 0 %
Lymphocytes Relative: 28 %
Lymphs Abs: 2.4 10*3/uL (ref 0.7–4.0)
MCH: 29.3 pg (ref 26.0–34.0)
MCHC: 33 g/dL (ref 30.0–36.0)
MCV: 88.9 fL (ref 80.0–100.0)
Monocytes Absolute: 0.5 10*3/uL (ref 0.1–1.0)
Monocytes Relative: 6 %
Neutro Abs: 5.4 10*3/uL (ref 1.7–7.7)
Neutrophils Relative %: 66 %
Platelets: 359 10*3/uL (ref 150–400)
RBC: 5.05 MIL/uL (ref 4.22–5.81)
RDW: 13.3 % (ref 11.5–15.5)
WBC: 8.3 10*3/uL (ref 4.0–10.5)
nRBC: 0 % (ref 0.0–0.2)

## 2022-05-12 LAB — COMPREHENSIVE METABOLIC PANEL
ALT: 13 U/L (ref 0–44)
AST: 23 U/L (ref 15–41)
Albumin: 4.7 g/dL (ref 3.5–5.0)
Alkaline Phosphatase: 60 U/L (ref 38–126)
Anion gap: 11 (ref 5–15)
BUN: 16 mg/dL (ref 6–20)
CO2: 24 mmol/L (ref 22–32)
Calcium: 9.5 mg/dL (ref 8.9–10.3)
Chloride: 103 mmol/L (ref 98–111)
Creatinine, Ser: 1.01 mg/dL (ref 0.61–1.24)
GFR, Estimated: >60 mL/min (ref >60)
Glucose, Bld: 87 mg/dL (ref 70–99)
Potassium: 4.4 mmol/L (ref 3.5–5.1)
Sodium: 138 mmol/L (ref 135–145)
Total Bilirubin: 0.7 mg/dL (ref 0.3–1.2)
Total Protein: 8.2 g/dL — ABNORMAL HIGH (ref 6.5–8.1)

## 2022-05-12 LAB — RAPID URINE DRUG SCREEN, HOSP PERFORMED
Amphetamines: NOT DETECTED
Barbiturates: NOT DETECTED
Benzodiazepines: NOT DETECTED
Cocaine: NOT DETECTED
Opiates: NOT DETECTED
Tetrahydrocannabinol: NOT DETECTED

## 2022-05-12 LAB — I-STAT BETA HCG BLOOD, ED (MC, WL, AP ONLY): I-stat hCG, quantitative: <5 m[IU]/mL (ref <5)

## 2022-05-12 LAB — ACETAMINOPHEN LEVEL: Acetaminophen (Tylenol), Serum: <10 ug/mL — ABNORMAL LOW (ref 10–30)

## 2022-05-12 LAB — ETHANOL: Alcohol, Ethyl (B): <10 mg/dL (ref <10)

## 2022-05-12 LAB — SALICYLATE LEVEL: Salicylate Lvl: <7 mg/dL — ABNORMAL LOW (ref 7.0–30.0)

## 2022-05-12 MED ORDER — CLONAZEPAM 0.5 MG PO TABS
0.5000 mg | ORAL_TABLET | Freq: Two times a day (BID) | ORAL | Status: DC | PRN
  Administered 2022-05-12: 0.5 mg via ORAL
  Filled 2022-05-12: qty 1

## 2022-05-12 MED ORDER — MAGNESIUM HYDROXIDE 400 MG/5ML PO SUSP: 30.0000 mL | Freq: Every day | ORAL | Status: DC | PRN

## 2022-05-12 MED ORDER — ALUM & MAG HYDROXIDE-SIMETH 200-200-20 MG/5ML PO SUSP: 30.0000 mL | ORAL | Status: DC | PRN

## 2022-05-12 MED ORDER — DIVALPROEX SODIUM 250 MG PO DR TAB
250.0000 mg | DELAYED_RELEASE_TABLET | Freq: Three times a day (TID) | ORAL | Status: DC
  Administered 2022-05-12 – 2022-05-13 (×2): 250 mg via ORAL
  Filled 2022-05-12 (×2): qty 1

## 2022-05-12 MED ORDER — ACETAMINOPHEN 325 MG PO TABS: 650.0000 mg | ORAL_TABLET | Freq: Four times a day (QID) | ORAL | Status: DC | PRN

## 2022-05-12 MED ORDER — LORAZEPAM 1 MG PO TABS
1.0000 mg | ORAL_TABLET | Freq: Once | ORAL | Status: AC
  Administered 2022-05-12: 1 mg via ORAL
  Filled 2022-05-12: qty 1

## 2022-05-12 MED ORDER — QUETIAPINE FUMARATE 50 MG PO TABS
50.0000 mg | ORAL_TABLET | Freq: Every day | ORAL | Status: DC
  Administered 2022-05-12: 50 mg via ORAL
  Filled 2022-05-12: qty 1

## 2022-05-12 NOTE — ED Provider Notes (Signed)
Behavioral Health Urgent Care Medical Screening Exam  Patient Name: Walter Howe MRN: [id number] Date of Evaluation: 05/12/22 Chief Complaint:   Diagnosis:  Final diagnoses:  Schizoaffective disorder, bipolar type (HCC)  Misuse of over-the-counter medications    History of Present illness: Walter Howe is a 36 y.o. adult. Patient presents voluntarily to [facility] behavioral health for walk-in assessment.  Patient is accompanied by his father, Walter Howe. Patient prefers that his father remain present during assessment.  Patient is assessed, face-to-face, by nurse practitioner. He is initially  seated in assessment area.  Consulted with attending provider,  Dr Viviano Simas, and chart reviewed on 05/12/2022. He is alert and oriented to self. He is partially oriented to situation and place.  He is disoriented to time.    Patient  presents with anxious mood, labile affect. He denies suicidal and homicidal ideations. Denies history of suicide attempts, denies history of non suicidal self-harm behaviors.    Eldar presents with paranoid ideations.  He states "I am trying to talk, I am worried about my mom, she is on oxygen and she has seconds to live."  Patient states "do not come near me, I do not know you!"  He appears to be potentially responding to internal stimuli.  Patient looks toward unoccupied corner of room, responds verbally.  Per patient's father, patient responding to internal stimuli at home, states to patient's father "do you hear that noise, I keep hearing that noise?"   He denies auditory and visual hallucinations.  He presents with delayed responses, concern for thought blocking.    He presents with bizarre behavior.  Patient moves fingers in the air, appears to be mimicking a Financial trader.  Patient restless, briefly seated then he kneels on the floor.  He states "am I praying?"  Walter Howe has been diagnosed with schizoaffective disorder, bipolar type, panis disorder, PTSD,  GAD, suicidal ideations, MDD . He is followed by outpatient psychiatry at [facility] behavioral health outpatient.  He has been briefly stable on a combination of medications including quetiapine 50 mg nightly, clonazepam 0.5 mg twice daily as needed and Depakote 250 mg 3 times daily.  Patient stable on this medication regimen with his father to assist with medication administration.  Approximately 2 months ago patient began administering medications to himself, uncertain as to most recent complete compliance with medications.  Patient admitted to inpatient psychiatric treatment at [facility] behavioral health in January 2021.  No family mental health history reported.  Patient offered support and encouragement.  Patient refuses care, refuses vital sign collection, refuses COVID test and blood draw.  Patient is a limited historian at this time. Tailor gives verbal consent to speak with his father, Walter Howe.  Patient's father reports patient's behavior increasingly paranoid and concerning x 2 weeks.  Patient's father shares that patient wakes him up multiple times each day to "check to see that I am okay."  Patient also continually verbalizes concern that his mother is "not okay." Per father, Kunta sleeping 12-15 per day, eating very little.  Per patient's father,  patient misuses clonazepam as prescribed by outpatient psychiatry.  "He takes all of those in a few days, I think he needs a higher dose."  Per patient's father patient misuses diphenhydramine.  Patient ingesting up to #25 tablets of 25 mg diphenhydramine daily.  Per patient's father, patient potentially not complaint with prescribed medications including Seroquel, for approx 2 months.  Reviewed treatment plan to include inpatient psychiatric treatment once medically cleared in the  emergency department with patient and his father, both verbalized understanding.  Flowsheet Row Office Visit from 09/14/2021 in [facility]  Office Visit from 07/13/2021 in [facility] Video Visit from 01/04/2021 in [facility]  C-SSRS RISK CATEGORY No Risk Low Risk No Risk       Psychiatric Specialty Exam  Presentation  General Appearance:Casual; Appropriate for Environment  Eye Contact:Fair  Speech:Slow  Speech Volume:Normal  Handedness:Right   Mood and Affect  Mood: Anxious  Affect: Labile   Thought Process  Thought Processes: Disorganized  Descriptions of Associations:Tangential  Orientation:Partial  Thought Content:Tangential; Paranoid Ideation  Diagnosis of Schizophrenia or Schizoaffective disorder in past: Yes  Duration of Psychotic Symptoms: Greater than six months  Hallucinations:-- (Patient denies auditry and visual hallucinations)  Ideas of Reference:Paranoia  Suicidal Thoughts:No  Homicidal Thoughts:No   Sensorium  Memory: Immediate Poor  Judgment: Impaired  Insight: Lacking   Executive Functions  Concentration: Poor  Attention Span: Poor  Recall: Poor  Fund of Knowledge: Fair  Language: Fair   Psychomotor Activity  Psychomotor Activity: Increased   Assets  Assets: Housing; Resilience; Social Support   Sleep  Sleep: Good  Number of hours:  12   Nutritional Assessment (For OBS and [facility] admissions only) Has the patient had a weight loss or gain of 10 pounds or more in the last 3 months?: No Has the patient had a decrease in food intake/or appetite?: No Does the patient have dental problems?: No Does the patient have eating habits or behaviors that may be indicators of an eating disorder including binging or inducing vomiting?: No Has the patient recently lost weight without trying?: 0 Has the patient been eating poorly because of a decreased appetite?: 0 Malnutrition Screening Tool Score: 0    Physical Exam: Physical Exam Vitals and nursing note reviewed.  Constitutional:       Appearance: Normal appearance. He is well-developed.  HENT:     Head: Normocephalic and atraumatic.     Nose: Nose normal.  Pulmonary:     Effort: Pulmonary effort is normal.  Musculoskeletal:        General: Normal range of motion.     Cervical back: Normal range of motion.  Skin:    General: Skin is warm and dry.  Neurological:     Mental Status: He is alert and oriented to person, place, and time.     Motor: Tremor present.     Comments: Fine tremor noted to bilateral hands  Psychiatric:        Mood and Affect: Mood is anxious. Affect is labile.        Speech: Speech is delayed and tangential.        Behavior: Behavior is uncooperative and hyperactive.        Thought Content: Thought content is paranoid and delusional.    Review of Systems  Constitutional: Negative.   HENT: Negative.    Eyes: Negative.   Respiratory: Negative.    Cardiovascular: Negative.   Gastrointestinal: Negative.   Genitourinary: Negative.   Musculoskeletal: Negative.   Skin: Negative.   Neurological:  Positive for tremors.   There were no vitals taken for this visit. There is no height or weight on file to calculate BMI.  Musculoskeletal: Strength & Muscle Tone: within normal limits Gait & Station: normal Patient leans: Backward   [facility] MSE Discharge Disposition for Follow up and Recommendations: Based on my evaluation the patient appears to have an  emergency medical condition for which I recommend the patient be transferred to the emergency department for further evaluation.   Laboratory studies ordered including CBC, CMP, ethanol, A1c, lipid panel, magnesium, prolactin and TSH.  Urine drug screen order initiated.  Valproic acid level EKG ordered.   Patient placed under involuntary commitment by this Clinical research associate. Inpatient psychiatric treatment recommended once medically cleared.   Patient accepted to [facility] emergency department by Dr Jearld Fenton for medical clearance. He can return to [facility] once medically cleared.    Lenard Lance, FNP 05/12/2022, 12:05 PM

## 2022-05-12 NOTE — ED Provider Notes (Signed)
[facility]-EMERGENCY DEPT Provider Note   CSN: [id number] Arrival date & time: 05/12/22  1501     History Chief Complaint  Patient presents with   IVC    Walter Howe is a 36 y.o. adult presents to the ER from [facility] for medical clearance. The patient was [facility] by [facility] staff. History is limited at the patient is experiencing mental health issues. Per the IVC paperwork, patient has a history of schizoaffective disorder, bipolar type and has been noncompliant with his medications.  Per father, per IVC paperwork, he reports the patient's had increasingly bizarre behavior it was been concerning for the past 2 weeks.  Reports that he is been misusing his clonazepam as prescribed by outpatient psychiatry.  He "he takes all of those in a few days, I think he needs a higher dose". Per patient's father reports that he misses diphenhydramine and the patient's been ingesting up to 25 tablets of 25 mg of diphenhydramine daily.  HPI     Home Medications Prior to Admission medications   Medication Sig Start Date End Date Taking? Authorizing Provider  busPIRone (BUSPAR) 15 MG tablet Take 1 tablet (15 mg total) by mouth 3 (three) times daily. 09/14/21   Nwoko, Tommas Olp, PA  clonazePAM (KLONOPIN) 0.5 MG tablet Take 1 tablet (0.5 mg total) by mouth 2 (two) times daily as needed for anxiety. 04/01/22 05/31/22  Lauro Franklin, MD  divalproex (DEPAKOTE) 250 MG DR tablet Take 1 tablet (250 mg total) by mouth 3 (three) times daily. 04/01/22   Lauro Franklin, MD  FLUoxetine (PROZAC) 40 MG capsule Take 2 capsules (80 mg total) by mouth daily. 11/20/20 11/20/21  Nwoko, Tommas Olp, PA  hydrOXYzine (ATARAX/VISTARIL) 25 MG tablet Take 1 tablet (25 mg total) by mouth 3 (three) times daily as needed for anxiety. 05/30/19   Armandina Stammer I, NP  QUEtiapine (SEROQUEL) 50 MG tablet Take 1 tablet (50 mg total) by mouth at bedtime. 04/01/22   Lauro Franklin, MD      Allergies    Patient  has no known allergies.    Review of Systems   Review of Systems  Unable to perform ROS: Psychiatric disorder    Physical Exam Updated Vital Signs BP 117/87 (BP Location: Left Arm)   Pulse (!) 109   Temp 98.6 F (37 C) (Oral)   Resp 20   Ht 5\' 7"  (1.702 m)   Wt 50 kg   SpO2 100%   BMI 17.26 kg/m  Physical Exam Vitals and nursing note reviewed.  Constitutional:      Appearance: Normal appearance.     Comments: Anxious appearing  Eyes:     General: No scleral icterus. Pulmonary:     Effort: Pulmonary effort is normal. No respiratory distress.  Skin:    General: Skin is dry.     Findings: No rash.  Neurological:     General: No focal deficit present.     Mental Status: He is alert. Mental status is at baseline.  Psychiatric:        Mood and Affect: Mood is anxious.        Speech: Speech is tangential.        Behavior: Behavior is agitated.     Comments: They do not appear to be responding to internal stimuli. Tangential speech, odd affect with bizarre behavior.      ED Results / Procedures / Treatments   Labs (all labs ordered are listed, but only abnormal  results are displayed) Labs Reviewed  COMPREHENSIVE METABOLIC PANEL - Abnormal; Notable for the following components:      Result Value   Total Protein 8.2 (*)    All other components within normal limits  SALICYLATE LEVEL - Abnormal; Notable for the following components:   Salicylate Lvl <7.0 (*)    All other components within normal limits  ACETAMINOPHEN LEVEL - Abnormal; Notable for the following components:   Acetaminophen (Tylenol), Serum <10 (*)    All other components within normal limits  ETHANOL  CBC WITH DIFFERENTIAL/PLATELET  RAPID URINE DRUG SCREEN, HOSP PERFORMED  I-STAT BETA HCG BLOOD, ED ([facility], [facility], [facility] ONLY)    EKG EKG Interpretation  Date/Time:  Thursday May 12 2022 17:24:30 EST Ventricular Rate:  113 PR Interval:  118 QRS Duration: 90 QT Interval:  334 QTC Calculation: 458 R  Axis:   82 Text Interpretation: Sinus tachycardia Minimal voltage criteria for LVH, may be normal variant ( Sokolow-Lyon ) Nonspecific ST abnormality Abnormal ECG When compared with ECG of 19-Dec-2019 01:55, No significant change since last tracing Confirmed by Linwood Dibbles [phone]) on 05/12/2022 9:06:11 PM  Radiology No results found.  Procedures Procedures   Medications Ordered in ED Medications  LORazepam (ATIVAN) tablet 1 mg (1 mg Oral Given 05/12/22 1759)    ED Course/ Medical Decision Making/ A&P Clinical Course as of 05/12/22 1848  Thu May 12, 2022  1618 On the phone with poison control.  [RR]    Clinical Course User Index [RR] Achille Rich, PA-C                           Medical Decision Making Amount and/or Complexity of Data Reviewed Labs: ordered.  Risk Prescription drug management.   36 year old male presents the emergency room today for evaluation of his possible psychiatric condition.  Differential diagnosis includes limited to medical clearance versus psych versus electrolyte derangement.  Vital signs show tachycardia at 109 likely anxiety, normotensive, afebrile, satting well on room air without increased work of breathing.  Physical exam as noted above.  Medical clearance labs ordered.  Was on the phone with [facility] who recommended ordering a salicylate and acetaminophen level.  Warned against urinary retention.  Recommend giving some bicarb as patient has prolonged QT.  Patient is on a prolonged QT.  Asked patient needs to urinate which he said he did.  He reports no problems with urination.  I independently reviewed and interpreted the patient's labs.  Sounds like little, acetaminophen, and ethanol levels are undetectable.  hCG is negative.  CMP shows mildly increased total protein 8.2 otherwise no electrolyte or LFT abnormality.  CBC without abnormality as well.  UDS shows unremarkable.  EKG unchanged from previous.   On the phone with [facility] again about new labs and vitals and re-evaluation. Once the patient has been observed for 6 hours, the patient can be medically clear.   Patient's already IVC by behavior health urgent care. Daily medications ordered. Patient has been in the ER for over 6 hours without any change in status. Patient is medically clear for TTS consult.   Final Clinical Impression(s) / ED Diagnoses Final diagnoses:  Schizoaffective disorder, bipolar type [facility])    Rx / DC Orders ED Discharge Orders     None         Achille Rich, Cordelia Poche 05/12/22 2107    Gwyneth Sprout, MD 05/16/22 1900

## 2022-05-12 NOTE — BH Assessment (Signed)
Comprehensive Clinical Assessment (CCA) Note  05/12/2022 Walter Howe PD:[id number]  DISPOSITION: Per Beatriz Stallion NP, pt is recommended for observation at [facility].  The patient demonstrates the following risk factors for suicide: Chronic risk factors for suicide include: psychiatric disorder of schizoaffective d/o and substance use disorder. Acute risk factors for suicide include: unemployment, social withdrawal/isolation, and loss (financial, interpersonal, professional). Protective factors for this patient include: positive social support and hope for the future. Considering these factors, the overall suicide risk at this point appears to be low. Patient is appropriate for outpatient follow up.   Pt is a 36 yo male who presented voluntarily and accompanied by his father, [name], seeking evaluation for psychiatric medications. Pt came to be seen upstairs for walk-in hours in the OP office but had missed the window for new pts. Pt stated he has a hx of schizoaffective d/o, ADHD and GAD. Per dad, pt had been prescribed and taking medications up until about 2 months ago who he assumed responsibility for managing his own medications and stopped taking the medications soon after. Pt stated he does not have an OP therapist currently. Pt denied SI, past attempts to kill himself, HI,NSSH and any current substance abuse. Pt reported some AVH although he could not articulate what he was seeing or hearing possibly due to anxiety. Pt stated that he "knew" people were following him, talking about him and wanting to hurt him. Pt stated that he sometimes consumes alcohol but had not had any in "a few weeks." Pt's father, [name], was present and participated with pt's verbal permission. Per father, pt lives with him but father is not legal guardian for pt. He is his own guardian.   Pt was alert, seemed oriented, calm and cooperative. His presentation was a bit bizarre and at times it appeared he may be responding to  internal stimuli. Pt would begin talking and abruptly stop for example. His action could have been related to anxiety. He commented on his being in a new and unfamiliar environment several times. Pt's insight and judgment seemed impaired. Pt presented as if responding to internal stimuli and actively psychotic.    Chief Complaint:  Chief Complaint  Patient presents with   Schizophrenia   Anxiety   ADHD   Visit Diagnosis:  Schizoaffective d/o GAD ADHD TBI per hx    CCA Screening, Triage and Referral (STR)  Patient Reported Information How did you hear about [place]? Self  What Is the Reason for Your Visit/Call Today? Pt is a 36 yo male who presented voluntarily and accompanied by his father, [name], seeking evaluation for psychiatric medications. Pt came to be seen upstairs for walk-in hours in the OP office but had missed the window for new pts. Pt stated he has a hx of schizoaffective d/o, ADHD and GAD. Per dad, pt had been prescribed and taking medications up until about 2 months ago who he assumed responsibility for managing his own medications and stopped taking the medications soon after. Pt stated he does not have an OP therapist currently. Pt denied SI, past attempts to kill himself, HI,NSSH and any current substance abuse. Pt reported some AVH although he could not articulate what he was seeing or hearing possibly due to anxiety. Pt stated that he "knew" people were following him, talking about him and wanting to hurt him. Pt stated that he sometimes consumes alcohol but had not had any in "a few weeks."  How Long Has This Been Causing You Problems? > than 6  months  What Do You Feel Would Help You the Most Today? Treatment for Depression or other mood problem   Have You Recently Had Any Thoughts About Hurting Yourself? No  Are You Planning to Commit Suicide/Harm Yourself At This time? No   Flowsheet Row Office Visit from 09/14/2021 in [facility]  Office Visit from 07/13/2021 in [facility] Video Visit from 01/04/2021 in [facility] No Risk Low Risk No Risk       Have you Recently Had Thoughts About [place]? No  Are You Planning to Harm Someone at This Time? No  Explanation: No data recorded  Have You Used Any Alcohol or Drugs in the Past 24 Hours? No  What Did You Use and How Much? No data recorded  Do You Currently Have a Therapist/Psychiatrist? No  Name of Therapist/Psychiatrist:    Have You Been Recently Discharged From Any Office Practice or Programs? No (none reported)  Explanation of Discharge From Practice/Program: No data recorded    CCA Screening Triage Referral Assessment Type of Contact: Face-to-Face  Telemedicine Service Delivery:   Is this Initial or Reassessment?   Date Telepsych consult ordered in [facility]:    Time Telepsych consult ordered in [facility]:    Location of Assessment: [facility] Assessment Services  Provider Location: [facility] Assessment Services   Collateral Involvement: Pt's father, [name], was present and participated with pt's verbal permission.   Does Patient Have a Stage manager Guardian? No  Legal Guardian Contact Information: No data recorded Copy of Legal Guardianship Form: No data recorded Legal Guardian Notified of Arrival: No data recorded Legal Guardian Notified of Pending Discharge: No data recorded If Minor and Not Living with Parent(s), Who has Custody? No data recorded Is CPS involved or ever been involved? Never (none reported)  Is APS involved or ever been involved? Never (none reported)   Patient Determined To Be At Risk for Harm To Self or Others Based on Review of Patient Reported Information or Presenting Complaint? Yes, for Self-Harm  Method: No Plan  Availability of Means: No access or NA (denied access)  Intent: Vague intent or NA  Notification Required: No data  recorded Additional Information for Danger to Others Potential: Active psychosis  Additional Comments for Danger to Others Potential: Pt presented as if responding to internal stimuli and actively psychotic.  Are There Guns or Other Weapons in [place]? No  Types of Guns/Weapons: na  Are These Weapons Safely Secured?                            No data recorded Who Could Verify You Are Able To Have These Secured: No data recorded Do You Have any Outstanding Charges, Pending Court Dates, Parole/Probation? uta (unable to assess)  Contacted To Inform of Risk of Harm To Self or Others: No data recorded   Does Patient Present under Involuntary Commitment? No    [place] of Residence: [place]   Patient Currently Receiving the Following Services: Not Receiving Services   Determination of Need: Urgent (48 hours)   Options For Referral: [facility]     CCA Biopsychosocial Patient Reported Schizophrenia/Schizoaffective Diagnosis in Past: Yes   Strengths: Pt is respectful towards staff.   Mental Health Symptoms Depression:   Difficulty Concentrating (UTA)   Duration of Depressive symptoms:  Duration of Depressive Symptoms: Greater than two weeks  Mania:   None (UTA)   Anxiety:    Difficulty concentrating; Restlessness   Psychosis:   Delusions; Grossly disorganized speech; Grossly disorganized or catatonic behavior; Hallucinations   Duration of Psychotic symptoms:  Duration of Psychotic Symptoms: Greater than six months   Trauma:   None (UTA)   Obsessions:   None (UTA)   Compulsions:   None (UTA)   Inattention:   N/A (UTA)   Hyperactivity/Impulsivity:   N/A (UTA)   Oppositional/Defiant Behaviors:   N/A (UTA)   Emotional Irregularity:   Potentially harmful impulsivity   Other Mood/Personality Symptoms:   None noted    Mental Status Exam Appearance and self-care  Stature:   Small   Weight:   Thin   Clothing:   Casual; Disheveled    Grooming:   Normal   Cosmetic use:   None   Posture/gait:   Other (Comment) (Pt was sitting some times and was standing at others, reacting to internal stimuli)   Motor activity:   Agitated; Repetitive; Slowed   Sensorium  Attention:   Distractible   Concentration:   Scattered; Anxiety interferes   Orientation:   Person; Place; Situation; Object; Time; X5   Recall/memory:   -- (UTA)   Affect and Mood  Affect:   Anxious; Constricted   Mood:   Anxious   Relating  Eye contact:   Fleeting (Varied; at times, had normal eye contact and at other times, had avoidant eye contact.)   Facial expression:   Anxious   Attitude toward examiner:   Cooperative; Defensive; Suspicious; Resistant   Thought and Language  Speech flow:  Flight of Ideas; Paucity   Thought content:   Delusions; Appropriate to Mood and Circumstances   Preoccupation:   None   Hallucinations:   Auditory; Visual   Organization:   Disorganized   Transport planner of Knowledge:   -- Special educational needs teacher)   Intelligence:   -- Special educational needs teacher)   Abstraction:   -- (UTA)   Judgement:   -- (UTA)   Reality Testing:   -- (UTA)   Insight:   -- (UTA)   Decision Making:   -- (UTA)   Social Functioning  Social Maturity:   -- Special educational needs teacher)   Social Judgement:   -- Special educational needs teacher)   Stress  Stressors:   -- (UTA)   Coping Ability:   -- Special educational needs teacher)   Skill Deficits:   -- (UTA)   Supports:   Family; Support needed     Religion: Religion/Spirituality Are You A Religious Person?:  (UTA) How Might This Affect Treatment?: UTA  Leisure/Recreation: Leisure / Recreation Do You Have Hobbies?:  (UTA)  Exercise/Diet: Exercise/Diet Do You Exercise?:  (UTA) Have You Gained or Lost A Significant Amount of Weight in the Past Six Months?:  (UTA) Do You Follow a Special Diet?:  (UTA) Do You Have Any Trouble Sleeping?:  (UTA)   CCA Employment/Education Employment/Work Situation: Employment / Work  Situation Employment Situation: On disability Patient's Job has Been Impacted by Current Illness:  (N/A) 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)  Education: Education Is Patient Currently Attending School?: No Last Grade Completed:  (UTA) Did You Attend College?:  (UTA) Did You Have An Individualized Education Program (IIEP):  (UTA) Did You Have Any Difficulty At School?:  (UTA)   CCA Family/Childhood History Family and Relationship History: Family history Marital status: Single Does patient have children?:  (UTA)  Childhood History:  Childhood History By whom was/is the patient raised?:  (UTA) Did  patient suffer any verbal/emotional/physical/sexual abuse as a child?:  (UTA) Has patient ever been sexually abused/assaulted/raped as an adolescent or adult?:  (UTA) Witnessed domestic violence?:  (UTA) Has patient been affected by domestic violence as an adult?:  (UTA)       CCA Substance Use Alcohol/Drug Use: Alcohol / Drug Use Pain Medications: Please see MAR Prescriptions: Please see MAR Over the Counter: Please see MAR History of alcohol / drug use?: Yes (Pt does not currently use substances and has not in some time) Longest period of sobriety (when/how long): Unknown Substance #1 Name of Substance 1: alcohol 1 - Age of First Use: unknown 1 - Amount (size/oz): unknown 1 - Frequency: unknown 1 - Duration: ongoing 1 - Last Use / Amount: "a few weeks ago" 1 - Method of Aquiring: unknown 1- Route of Use: drink/oral Substance #2 Name of Substance 2: (Hx of methamphetamine use) 2 - Age of First Use: unknown 2 - Amount (size/oz): unknown 2 - Frequency: unknown 2 - Duration: unknown 2 - Last Use / Amount: unknown 2 - Method of Aquiring: unknown 2 - Route of Substance Use: unknown 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 3 - Age of First Use: unknown 3 - Amount (size/oz): 25- 25 mg tabs 3 - Frequency: daily 3 - Duration: ongoing 3 - Last Use /  Amount: yesterday 3 - Method of Aquiring: unknown 3 - Route of Substance Use: oral                   ASAM's:  Six Dimensions of Multidimensional Assessment  Dimension 1:  Acute Intoxication and/or Withdrawal Potential:   Dimension 1:  Description of individual's past and current experiences of substance use and withdrawal: uta  Dimension 2:  Biomedical Conditions and Complications:   Dimension 2:  Description of patient's biomedical conditions and  complications: uta  Dimension 3:  Emotional, Behavioral, or Cognitive Conditions and Complications:  Dimension 3:  Description of emotional, behavioral, or cognitive conditions and complications: Pt stated hx of schixoaffective d/o, GAD and ADHD  Dimension 4:  Readiness to Change:  Dimension 4:  Description of Readiness to Change criteria: uta  Dimension 5:  Relapse, Continued use, or Continued Problem Potential:  Dimension 5:  Relapse, continued use, or continued problem potential critiera description: uta  Dimension 6:  Recovery/Living Environment:  Dimension 6:  Recovery/Iiving environment criteria description: uta  ASAM Severity Score:    ASAM Recommended Level of Treatment:     Substance use Disorder (SUD) Substance Use Disorder (SUD)  Checklist Symptoms of Substance Use:  (uta)  Recommendations for Services/Supports/Treatments: Recommendations for Services/Supports/Treatments Recommendations For Services/Supports/Treatments:  Rich Reining)  Discharge Disposition:    DSM5 Diagnoses: Patient Active Problem List   Diagnosis Date Noted   Panic disorder 11/20/2020   Moderate episode of recurrent major depressive disorder (HCC) 04/30/2020   Generalized anxiety disorder 04/30/2020   Insomnia 04/30/2020   Schizoaffective disorder, bipolar type (HCC) 11/07/2019   Posttraumatic stress disorder 11/07/2019   Nicotine dependence, other tobacco product, uncomplicated 11/07/2019   MDD (major depressive disorder) 05/26/2019   Suicidal  ideations 05/26/2019   Closed displaced oblique fracture of shaft of right humerus 03/01/2019   FINGER PAIN 08/28/2007   FRACTURE, FINGER, PROXIMAL 08/28/2007     Referrals to Alternative Service(s): Referred to Alternative Service(s):   Place:   Date:   Time:    Referred to Alternative Service(s):   Place:   Date:   Time:    Referred to Alternative  Service(s):   Place:   Date:   Time:    Referred to Alternative Service(s):   Place:   Date:   Time:     Fuller Mandril, Counselor  Stanton Kidney T. Mare Ferrari, [place], [facility], [facility] Triage Specialist [facility]

## 2022-05-12 NOTE — ED Triage Notes (Signed)
Pt BIB [facility] with IVC paperwork.

## 2022-05-12 NOTE — Consult Note (Signed)
[name] ED ASSESSMENT   Reason for Consult:  Psychiatric Consult Referring Physician:  Achille Rich, PA-C Patient Identification: Walter Howe MRN:  [id number] ED Chief Complaint: Schizoaffective disorder, bipolar type [facility])  Diagnosis:  Principal Problem:   Schizoaffective disorder, bipolar type (HCC) Active Problems:   Generalized anxiety disorder   ED Assessment Time Calculation: Start Time: 1930 Stop Time: 2000 Total Time in Minutes (Assessment Completion): 30   HPI:  Per admission note Walter Howe is a 36 y.o. adult patient is here at [facility] for medical clearance, he was IVC'd at [facility] by staff. Per IVC paperwork patient has a history of schizoaffective disorder, bipolar type and has been noncompliant with his medications.  Per father, per IVC paperwork he reports the patient has had increased bizarre behavior and it has been concerning for the past 2 weeks.  Reports that he is misusing his clonazepam as as prescribed by outpatient psychiatry.  He takes all of those in a few days, per father he needs a higher dose.  Per patient's father reports that he misuses diphenhydramine and the patient's been ingesting up to 25 tablets of 25 mg of diphenhydramine daily.  Subjective: Walter Howe, 36 y.o., adult patient seen face to face by this provider, and chart reviewed on 05/12/22.  On evaluation Walter Howe reports that he is having some anxiety and that he needs to go home now. Patient is very pleasant and apologetic, but feels like he needs to be at home with his parents. Patient given Ativan 1 mg PO by EDP, but says it is not helping and he would like to go home "please". Provider discussed with him that she needed to talk with him a little more, so a determination of safety could be made. Patient denied SI/HI/AH, but when asked if he was having any visual hallucinations he stated "maybe I can see a flashing light that you can not see". Provider asked him does he take medications and he stated  "I prefer not to say, because I am trying to have a fresh start", and if I say anything I may not have a fresh start".   During evaluation Walter Howe is sitting on his bed, he does appear anxious and tense, with a flat affect. He stated that he was tense, and tired. He is alert and partially oriented, he is calm, cooperative and attentive, bizarre presentation. His speech is slow, and it appears he is thinking about what he wants to say,  his volume is decreased. Calm behavior. Patient maybe having some delusional thinking, and at times seems distracted. He denies suicidal/self-harm/homicidal ideation, and paranoia. Patient denies using any alcohol or substances.   Attempted to speak with dad Walter Howe, no answer.   Past Psychiatric History: Schizoaffective d/o, anxiety and ADHD  Risk to Self or Others: Is the patient at risk to self? No Has the patient been a risk to self in the past 6 months? No Has the patient been a risk to self within the distant past? No Is the patient a risk to others? No Has the patient been a risk to others in the past 6 months? No Has the patient been a risk to others within the distant past? No  [place] Scale:  Garment/textile technologist Visit from 09/14/2021 in [facility] Office Visit from 07/13/2021 in [facility] Video Visit from 01/04/2021 in [facility]  C-SSRS RISK CATEGORY No Risk Low Risk  No Risk       AIMS:  , , ,  ,   ASAM:    Substance Abuse:     Past Medical History:  Past Medical History:  Diagnosis Date   Anxiety    03/05/2019- in the past, not current   History of kidney stones    passed    Past Surgical History:  Procedure Laterality Date   NO PAST SURGERIES     ORIF HUMERUS FRACTURE Right 03/07/2019   Procedure: OPEN REDUCTION INTERNAL FIXATION (ORIF) RIGHT HUMERUS;  Surgeon: Tarry KosXu, Naiping M, MD;  Location: [facility] OR;  Service: Orthopedics;   Laterality: Right;   Family History: No family history on file.   Social History:  Social History   Substance and Sexual Activity  Alcohol Use Yes   Comment: ocassional     Social History   Substance and Sexual Activity  Drug Use Yes   Comment: vicodin    Social History   Socioeconomic History   Marital status: Single    Spouse name: Not on file   Number of children: Not on file   Years of education: Not on file   Highest education level: Not on file  Occupational History   Not on file  Tobacco Use   Smoking status: Former    Types: Cigarettes    Quit date: 02/20/2019    Years since quitting: 3.2   Smokeless tobacco: Never  Vaping Use   Vaping Use: Every day   Devices: used i n attempt to stop smoking- did n ot work , quit vaping- 2019  Substance and Sexual Activity   Alcohol use: Yes    Comment: ocassional   Drug use: Yes    Comment: vicodin   Sexual activity: Not on file  Other Topics Concern   Not on file  Social History Narrative   Not on file   Social Determinants of Health   Financial Resource Strain: Not on file  Food Insecurity: Not on file  Transportation Needs: Not on file  Physical Activity: Not on file  Stress: Not on file  Social Connections: Not on file      Allergies:  No Known Allergies  Labs:  Results for orders placed or performed during the hospital encounter of 05/12/22 (from the past 48 hour(s))  Comprehensive metabolic panel     Status: Abnormal   Collection Time: 05/12/22  5:30 PM  Result Value Ref Range   Sodium 138 135 - 145 mmol/L   Potassium 4.4 3.5 - 5.1 mmol/L   Chloride 103 98 - 111 mmol/L   CO2 24 22 - 32 mmol/L   Glucose, Bld 87 70 - 99 mg/dL    Comment: Glucose reference range applies only to samples taken after fasting for at least 8 hours.   BUN 16 6 - 20 mg/dL   Creatinine, Ser 6.211.01 0.61 - 1.24 mg/dL   Calcium 9.5 8.9 - 30.810.3 mg/dL   Total Protein 8.2 (H) 6.5 - 8.1 g/dL   Albumin 4.7 3.5 - 5.0 g/dL   AST 23  15 - 41 U/L   ALT 13 0 - 44 U/L   Alkaline Phosphatase 60 38 - 126 U/L   Total Bilirubin 0.7 0.3 - 1.2 mg/dL   GFR, Estimated >65>60 >78>60 mL/min    Comment: (NOTE) Calculated using the CKD-EPI Creatinine Equation (2021)    Anion gap 11 5 - 15    Comment: Performed at [facility], [address] [place], [place]  [address]  Ethanol     Status: None   Collection Time: 05/12/22  5:30 PM  Result Value Ref Range   Alcohol, Ethyl (B) <10 <10 mg/dL    Comment: (NOTE) Lowest detectable limit for serum alcohol is 10 mg/dL.  For medical purposes only. Performed at [facility], [address] [place], [place] [address]   CBC with Diff     Status: None   Collection Time: 05/12/22  5:30 PM  Result Value Ref Range   WBC 8.3 4.0 - 10.5 K/uL   RBC 5.05 4.22 - 5.81 MIL/uL   Hemoglobin 14.8 13.0 - 17.0 g/dL   HCT 09.8 11.9 - 14.7 %   MCV 88.9 80.0 - 100.0 fL   MCH 29.3 26.0 - 34.0 pg   MCHC 33.0 30.0 - 36.0 g/dL   RDW 82.9 56.2 - 13.0 %   Platelets 359 150 - 400 K/uL   nRBC 0.0 0.0 - 0.2 %   Neutrophils Relative % 66 %   Neutro Abs 5.4 1.7 - 7.7 K/uL   Lymphocytes Relative 28 %   Lymphs Abs 2.4 0.7 - 4.0 K/uL   Monocytes Relative 6 %   Monocytes Absolute 0.5 0.1 - 1.0 K/uL   Eosinophils Relative 0 %   Eosinophils Absolute 0.0 0.0 - 0.5 K/uL   Basophils Relative 0 %   Basophils Absolute 0.0 0.0 - 0.1 K/uL   Immature Granulocytes 0 %   Abs Immature Granulocytes 0.02 0.00 - 0.07 K/uL    Comment: Performed at [facility], [address] [place], [place] [address]  Salicylate level     Status: Abnormal   Collection Time: 05/12/22  5:30 PM  Result Value Ref Range   Salicylate Lvl <7.0 (L) 7.0 - 30.0 mg/dL    Comment: Performed at [facility], [address], [place], [place] [address]  Acetaminophen level     Status: Abnormal   Collection Time: 05/12/22  5:30 PM  Result Value Ref Range   Acetaminophen  (Tylenol), Serum <10 (L) 10 - 30 ug/mL    Comment: (NOTE) Therapeutic concentrations vary significantly. A range of 10-30 ug/mL  may be an effective concentration for many patients. However, some  are best treated at concentrations outside of this range. Acetaminophen concentrations >150 ug/mL at 4 hours after ingestion  and >50 ug/mL at 12 hours after ingestion are often associated with  toxic reactions.  Performed at [facility], [address] [place], [place] [address]   I-Stat Beta hCG blood, ED ([facility], [facility], [facility] only)     Status: None   Collection Time: 05/12/22  5:37 PM  Result Value Ref Range   I-stat hCG, quantitative <5.0 <5 mIU/mL   Comment 3            Comment:   GEST. AGE      CONC.  (mIU/mL)   <=1 WEEK        5 - 50     2 WEEKS       50 - 500     3 WEEKS       100 - 10,000     4 WEEKS     1,000 - 30,000        MALE AND NON-PREGNANT MALE:     LESS THAN 5 mIU/mL     No current facility-administered medications for this encounter.   Current Outpatient Medications  Medication Sig Dispense Refill   busPIRone (BUSPAR) 15  MG tablet Take 1 tablet (15 mg total) by mouth 3 (three) times daily. 90 tablet 3   clonazePAM (KLONOPIN) 0.5 MG tablet Take 1 tablet (0.5 mg total) by mouth 2 (two) times daily as needed for anxiety. 60 tablet 1   divalproex (DEPAKOTE) 250 MG DR tablet Take 1 tablet (250 mg total) by mouth 3 (three) times daily. 90 tablet 3   FLUoxetine (PROZAC) 40 MG capsule Take 2 capsules (80 mg total) by mouth daily. 60 capsule 1   hydrOXYzine (ATARAX/VISTARIL) 25 MG tablet Take 1 tablet (25 mg total) by mouth 3 (three) times daily as needed for anxiety. 75 tablet 0   QUEtiapine (SEROQUEL) 50 MG tablet Take 1 tablet (50 mg total) by mouth at bedtime. 30 tablet 3    Musculoskeletal: Strength & Muscle Tone: within normal limits Gait & Station: normal Patient leans: N/A   Psychiatric Specialty Exam: Presentation  General Appearance:   Disheveled  Eye Contact: Fair  Speech: Slow  Speech Volume: Decreased  Handedness: Ambidextrous   Mood and Affect  Mood: Anxious  Affect: Flat   Thought Process  Thought Processes: Disorganized  Descriptions of Associations:Loose  Orientation:Partial  Thought Content:Delusions  History of Schizophrenia/Schizoaffective disorder:Yes  Duration of Psychotic Symptoms:Less than six months  Hallucinations:Hallucinations: Visual Description of Visual Hallucinations: lights flashing  Ideas of Reference:Delusions  Suicidal Thoughts:Suicidal Thoughts: No  Homicidal Thoughts:Homicidal Thoughts: No   Sensorium  Memory: Immediate Fair; Remote Fair  Judgment: Fair  Insight: Fair   Art therapist  Concentration: Fair  Attention Span: Fair  Recall: [employer] of Knowledge: Fair  Language: Fair   Psychomotor Activity  Psychomotor Activity: Psychomotor Activity: Tremor   Assets  Assets: Social Support; Housing    Sleep  Sleep: Sleep: Fair Number of Hours of Sleep: 12   Physical Exam: Physical Exam Vitals and nursing note reviewed.  HENT:     Nose: Nose normal.  Pulmonary:     Effort: Pulmonary effort is normal.  Musculoskeletal:        General: Normal range of motion.     Cervical back: Normal range of motion.  Neurological:     Mental Status: He is alert.  Psychiatric:        Attention and Perception: He perceives visual hallucinations.        Mood and Affect: Mood is anxious. Affect is flat.        Speech: Speech is delayed.        Behavior: Behavior is cooperative.        Thought Content: Thought content is delusional.        Cognition and Memory: Cognition is impaired.    Review of Systems  Constitutional: Negative.   Respiratory: Negative.    Gastrointestinal: Negative.   Psychiatric/Behavioral:  Positive for hallucinations.    Blood pressure 117/87, pulse (!) 109, temperature 98.6 F (37 C), temperature  source Oral, resp. rate 20, height 5\' 7"  (1.702 m), weight 50 kg, SpO2 100 %. Body mass index is 17.26 kg/m.  Medical Decision Making: Patient continues to require inpatient Psychiatric hospitalization. Patient is placed under IVC. He can return to [facility] once medically cleared.  Ativan 1 mg PO given to patient for Anxiety by EDP.   Disposition: Recommend psychiatric Inpatient admission when medically cleared.  [facility], PMHNP 05/12/2022 8:06 PM

## 2022-05-12 NOTE — ED Notes (Signed)
[phone] Walter Howe patients father would like to be called when a disposition is determined. Pt verbally consented for father to be contacted.

## 2022-05-12 NOTE — Progress Notes (Signed)
   05/12/22 1034  [facility] Triage Screening (Walk-ins at [facility] only)  How Did You Hear About [place]? Self  What Is the Reason for Your Visit/Call Today? Pt is a 36 yo male who presented voluntarily and accompanied by his father, Briscoe Burns, seeking evaluation for psychiatric medications. Pt came to be seen upstairs for walk-in hours in the OP office but had missed the windwo for new pts. Pt stated he has a hx of schizoaffective d/o, ADHD and GAD. Per dad, pt had been prescribed and taking medications up until about 2 months ago who he assumed responsibility for managing his own medications and stopped taking the medications soon after. Pt stated he does not have an OP therapist currently. Pt denied SI, past attempts to kill himself, HI,NSSH and any current substance abuse. Pt reported seom AVH although he could not articulate what he was seeing or hearing possibly due to anxiety. Pt stated that he "knew" people were follwing him, talking aobut him and wanting to hurt him. Pt stated that he sometimes consumes alcohol but had not had any in "a few weeks."  How Long Has This Been Causing You Problems? > than 6 months  Have You Recently Had Any Thoughts About Hurting Yourself? No  Are You Planning to Commit Suicide/Harm Yourself At This time? No  Have you Recently Had Thoughts About Hurting Someone Karolee Ohs? No  Are You Planning To Harm Someone At This Time? No  Are you currently experiencing any auditory, visual or other hallucinations? Yes  Please explain the hallucinations you are currently experiencing: Pt appeared to possibly be responding to internal stimuli.  Have You Used Any Alcohol or Drugs in the Past 24 Hours? No  Do you have any current medical co-morbidities that require immediate attention? No  Clinician description of patient physical appearance/behavior: Pt was alert, seemed oriented, calm and cooperative. His presentation was a bit bizare and at times it appeared he may be responding to internal stimuli.  Pt would begin talking and abruptly stop for example. His action could have been related to anxiety. He commented on his being in a new and unfamiliar environment several times. Pt's insight and judgment seemed impaired.  What Do You Feel Would Help You the Most Today? Treatment for Depression or other mood problem  If access to [facility] was not available, would you have sought care in the Emergency Department? Yes  Determination of Need Routine (7 days)  Options For Referral Medication Management   Dayla Gasca T. Jimmye Norman, MS, [facility], [facility] Triage Specialist [facility]

## 2022-05-12 NOTE — ED Notes (Signed)
PT had two bags. Located ib PT belongings hall B 9-12.

## 2022-05-13 ENCOUNTER — Encounter (HOSPITAL_COMMUNITY): Payer: Self-pay | Admitting: Psychiatry

## 2022-05-13 ENCOUNTER — Inpatient Hospital Stay (HOSPITAL_COMMUNITY)
Admission: AD | Admit: 2022-05-13 | Discharge: 2022-05-17 | DRG: 885 | Disposition: A | Discharge status: Home / Self Care | Payer: Federal, State, Local not specified - Other | Source: Intra-hospital | Attending: Psychiatry | Admitting: Psychiatry

## 2022-05-13 DIAGNOSIS — Z560 Unemployment, unspecified: Secondary | ICD-10-CM | POA: Diagnosis not present

## 2022-05-13 DIAGNOSIS — F250 Schizoaffective disorder, bipolar type: Secondary | ICD-10-CM | POA: Diagnosis present

## 2022-05-13 DIAGNOSIS — F1320 Sedative, hypnotic or anxiolytic dependence, uncomplicated: Secondary | ICD-10-CM | POA: Diagnosis present

## 2022-05-13 DIAGNOSIS — F17290 Nicotine dependence, other tobacco product, uncomplicated: Secondary | ICD-10-CM | POA: Diagnosis present

## 2022-05-13 DIAGNOSIS — F17210 Nicotine dependence, cigarettes, uncomplicated: Secondary | ICD-10-CM | POA: Diagnosis present

## 2022-05-13 DIAGNOSIS — Z91148 Patient's other noncompliance with medication regimen for other reason: Secondary | ICD-10-CM | POA: Diagnosis not present

## 2022-05-13 DIAGNOSIS — F209 Schizophrenia, unspecified: Principal | ICD-10-CM | POA: Diagnosis present

## 2022-05-13 DIAGNOSIS — Z79899 Other long term (current) drug therapy: Secondary | ICD-10-CM

## 2022-05-13 DIAGNOSIS — F419 Anxiety disorder, unspecified: Secondary | ICD-10-CM | POA: Diagnosis present

## 2022-05-13 DIAGNOSIS — Z1152 Encounter for screening for COVID-19: Secondary | ICD-10-CM

## 2022-05-13 LAB — RESP PANEL BY RT-PCR (RSV, FLU A&B, COVID)  RVPGX2
Influenza A by PCR: NEGATIVE
Influenza B by PCR: NEGATIVE
Resp Syncytial Virus by PCR: NEGATIVE
SARS Coronavirus 2 by RT PCR: NEGATIVE

## 2022-05-13 MED ORDER — MAGNESIUM HYDROXIDE 400 MG/5ML PO SUSP: 30.0000 mL | Freq: Every day | ORAL | Status: DC | PRN

## 2022-05-13 MED ORDER — DIVALPROEX SODIUM 250 MG PO DR TAB
250.0000 mg | DELAYED_RELEASE_TABLET | Freq: Three times a day (TID) | ORAL | Status: DC
  Administered 2022-05-14: 250 mg via ORAL
  Filled 2022-05-13 (×5): qty 1

## 2022-05-13 MED ORDER — BUSPIRONE HCL 15 MG PO TABS
15.0000 mg | ORAL_TABLET | Freq: Three times a day (TID) | ORAL | Status: DC
  Administered 2022-05-14: 15 mg via ORAL
  Filled 2022-05-13 (×5): qty 1

## 2022-05-13 MED ORDER — NICOTINE 21 MG/24HR TD PT24
21.0000 mg | MEDICATED_PATCH | Freq: Every day | TRANSDERMAL | Status: DC
  Filled 2022-05-13 (×3): qty 1

## 2022-05-13 MED ORDER — NICOTINE POLACRILEX 2 MG MT GUM
2.0000 mg | CHEWING_GUM | OROMUCOSAL | Status: DC | PRN
  Administered 2022-05-13: 2 mg via ORAL

## 2022-05-13 MED ORDER — QUETIAPINE FUMARATE 50 MG PO TABS
50.0000 mg | ORAL_TABLET | Freq: Every day | ORAL | Status: DC
  Administered 2022-05-13: 50 mg via ORAL
  Filled 2022-05-13 (×4): qty 1

## 2022-05-13 MED ORDER — ALUM & MAG HYDROXIDE-SIMETH 200-200-20 MG/5ML PO SUSP: 30.0000 mL | ORAL | Status: DC | PRN

## 2022-05-13 MED ORDER — ACETAMINOPHEN 325 MG PO TABS: 650.0000 mg | ORAL_TABLET | Freq: Four times a day (QID) | ORAL | Status: DC | PRN

## 2022-05-13 NOTE — ED Notes (Signed)
Patient went to the bathroom and then started walking real fast down the hall when asked where he was going , patient stated, "I need to find out whose in pain and I want to see that person. I'm a human being."

## 2022-05-13 NOTE — Progress Notes (Signed)
Recreation Therapy Notes  INPATIENT RECREATION THERAPY ASSESSMENT  Patient Details Name: Walter Howe MRN: [id number] DOB: [birth year] Today's Date: 05/13/2022       Information Obtained From: Patient  Able to Participate in Assessment/Interview: Yes  Patient Presentation: Alert (Soft spoken)  Reason for Admission (Per Patient): Other (Comments) (Pt stated he was at [facility] and kept trying to leave and they would pull him back.  Pt went on to say he was brought here by the cops.)  Patient Stressors: Other (Comment) ("the noices")  Coping Skills:   Journal, TV, Music, Exercise, Meditate, Deep Breathing, Talk, Art, Prayer, Avoidance  Leisure Interests (2+):  Music - Other (Comment), Community - Other (Comment) (Dance; Go for drives)  Frequency of Recreation/Participation: Other (Comment) (Dance- Depends; Drives- been Walter few days)  Awareness of Community Resources:  Yes  Community Resources:  Library, [employer], Research scientist (physical sciences), Public affairs consultant, Other (Comment) Public relations account executive)  Current Use:  (Goes to [employer] & grandmother's house sometimes; Everywhere else- Getting back into the world)  If no, Barriers?:    Expressed Interest in [employer] Information: No  [place] of Residence:  [place]  Patient Main Form of Transportation: Car  Patient Strengths:  Positivity; Good person  Patient Identified Areas of Improvement:  Attitude, Anxiety, Anger, Frustration  Patient Goal for Hospitalization:  "to get out of here"  Current SI (including self-harm):  No  Current HI:  No  Current AVH: No  Staff Intervention Plan: Group Attendance, Collaborate with Interdisciplinary Treatment Team  Consent to Intern Participation: N/Walter  Walter Howe, LRT,CTRS Walter Howe Walter Howe 05/13/2022, 2:06 PM

## 2022-05-13 NOTE — [facility] Suicide Risk Assessment (Signed)
[facility] INPATIENT:  Family/Significant Other Suicide Prevention Education  Suicide Prevention Education:  Education Completed; Walter Howe, father, [phone]  (name of family member/significant other) has been identified by the patient as the family member/significant other with whom the patient will be residing, and identified as the person(s) who will aid the patient in the event of a mental health crisis (suicidal ideations/suicide attempt).  With written consent from the patient, the family member/significant other has been provided the following suicide prevention education, prior to the and/or following the discharge of the patient.  CSW spoke with patient father who reports that patient will have good periods and then periods where he decompensates.  Patient was working at [employer] for 6 weeks and then started thinking that people were out to get him.  Patient is worried about father and people in his life and will want to check in on them every hour.  Patient recently has been disorganized and not being able to have a coherent conversation.  Patient has not been on medications and states that the medications causes him to be restless so he will stop them.  Father states that he will "suffer in silence"  Father states that patient can have a hard time focusing and was told that he had ADHD at some point and wanted doctors to know that. No guns or weapons and no safety concerns.   The suicide prevention education provided includes the following: Suicide risk factors Suicide prevention and interventions National Suicide Hotline telephone number [facility] assessment telephone number [facility] Emergency Assistance 911 [facility] and/or Residential Mobile Crisis Unit telephone number  Request made of family/significant other to: Remove weapons (Howe.g., guns, rifles, knives), all items previously/currently identified as safety concern.   Remove drugs/medications  (over-the-counter, prescriptions, illicit drugs), all items previously/currently identified as a safety concern.  The family member/significant other verbalizes understanding of the suicide prevention education information provided.  The family member/significant other agrees to remove the items of safety concern listed above.  Walter Howe Walter Howe 05/13/2022, 2:20 PM

## 2022-05-13 NOTE — B[facility] Counselor (Signed)
Adult Comprehensive Assessment  Patient ID: Walter Howe, adult   DOB: 08-18-1985, 36 y.o.   MRN: [id number]  Information Source: Information source: Patient  Current Stressors:  Patient states their primary concerns and needs for treatment are:: Patient reports increased anxiety and depression Patient states their goals for this hospitilization and ongoing recovery are:: Patient states wanting to be connected with a psychiatrist Educational / Learning stressors: no stressors Employment / Job issues: Patient was just fired from job Family Relationships: Patient states that he lives with his father, his father and him butt heads but he is Surveyor, mining / Lack of resources (include bankruptcy): patient primary source of income comes from employment and father Housing / Lack of housing: lives with father, has lived there for 5 years Physical health (include injuries & life threatening diseases): no stressors Social relationships: Patient states he has limited support and more of a Haematologist Substance abuse: denies at this time Bereavement / Loss: denies at this time  Living/Environment/Situation:  Living Arrangements: Parent Living conditions (as described by patient or guardian): okay, sometimes they butt heads with father Who else lives in the home?: father How long has patient lived in current situation?: 5 years What is atmosphere in current home: Supportive, Comfortable  Family History:  Marital status: Single Are you sexually active?: No What is your sexual orientation?: gay Has your sexual activity been affected by drugs, alcohol, medication, or emotional stress?: UTA Does patient have children?: No  Childhood History:  By whom was/is the patient raised?: Both parents Additional childhood history information: patient states that his childhood was "great" Description of patient's relationship with caregiver when they were a child: "good" Patient's description of current  relationship with people who raised him/her: "okay, we butt heads sometimes" How were you disciplined when you got in trouble as a child/adolescent?: UTA Did patient suffer any verbal/emotional/physical/sexual abuse as a child?: No Did patient suffer from severe childhood neglect?: No Has patient ever been sexually abused/assaulted/raped as an adolescent or adult?: No Was the patient ever a victim of a crime or a disaster?: No Witnessed domestic violence?: No Has patient been affected by domestic violence as an adult?: No  Education:  Highest grade of school patient has completed: 12 Currently a Consulting civil engineer?: No Learning disability?: No  Employment/Work Situation:   Employment Situation: Unemployed Patient's Job has Been Impacted by Current Illness: No What is the Longest Time Patient has Held a Job?: 12 years 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?: No  Financial Resources:   Financial resources: Income from employment, Support from parents / caregiver Does patient have a Lawyer or guardian?: No  Alcohol/Substance Abuse:   What has been your use of drugs/alcohol within the last 12 months?: patient denied If attempted suicide, did drugs/alcohol play a role in this?: No Alcohol/Substance Abuse Treatment Hx: Denies past history Has alcohol/substance abuse ever caused legal problems?: No  Social Support System:   Conservation officer, nature Support System: Fair Development worker, community Support System: father Type of faith/religion: none How does patient's faith help to cope with current illness?: none  Leisure/Recreation:   Do You Have Hobbies?: Yes Leisure and Hobbies: "singing and dancing, I do drag sometimes"  Strengths/Needs:   What is the patient's perception of their strengths?: empathetic, caring. Patient states they can use these personal strengths during their treatment to contribute to their recovery: Patient  states that he feels a lot and doesn't want to  bring other people down. Patient states these barriers may affect/interfere with their treatment: "I don't know" Patient states these barriers may affect their return to the community: "I don't know"  Discharge Plan:   Currently receiving community mental health services: No Patient states concerns and preferences for aftercare planning are: patient states that he does not want counseling but does want psychiatrist Patient states they will know when they are safe and ready for discharge when: patient states that he is ready for discharge Does patient have access to transportation?: Yes Does patient have financial barriers related to discharge medications?: No Patient description of barriers related to discharge medications: none Will patient be returning to same living situation after discharge?: Yes  Summary/Recommendations:   Summary and Recommendations (to be completed by the evaluator): Walter Howe is a 36 year old who identifies as male who was admitted to [facility] for bizarre behavior and increased anxiety and depression. Patient reports that he lost his job recently after working there for 3 months.  Patient was guarded throughout assessment.  Patient reports that he has a limited social support and tries to keep his distance from people because he is afraid that he will bring people down.  Patient currently lives with his father and states on occasion they but heads.  Patient states that he is working on getting his life together.  Patient has been connected with mental health in the past but currently is not.  He has a past psychiatric history of schizoaffective disorder, bipolar disorder. While here, Walter Howe can benefit from crisis stabilization, medication management, therapeutic milieu, and referrals for services.   Walter Howe E Walter Howe. 05/13/2022

## 2022-05-13 NOTE — Progress Notes (Signed)
   05/13/22 2000  Psych Admission Type (Psych Patients Only)  Admission Status Involuntary  Psychosocial Assessment  Patient Complaints Anxiety  Eye Contact Fair  Facial Expression Anxious  Affect Anxious  Speech Soft  Interaction Cautious;Guarded  Motor Activity Slow  Appearance/Hygiene Disheveled  Behavior Characteristics Anxious  Mood Anxious  Aggressive Behavior  Effect No apparent injury  Thought Process  Coherency Disorganized  Content Paranoia  Delusions WDL  Perception WDL  Hallucination None reported or observed  Judgment WDL  Confusion WDL  Danger to Self  Current suicidal ideation? Denies  Danger to Others  Danger to Others None reported or observed

## 2022-05-13 NOTE — [facility] Group Notes (Signed)
Spirituality group facilitated by Kathleen Argue, B[facility].  Group Description: Group focused on topic of hope. Patients participated in facilitated discussion around topic, connecting with one another around experiences and definitions for hope. Group members engaged with visual explorer photos, reflecting on what hope looks like for them today. Group engaged in discussion around how their definitions of hope are present today in hospital.  Modalities: Psycho-social ed, Adlerian, Narrative, MI  Patient Progress: Walter Howe came towards the end of group, but did engage in the conversation as soon as he came.    [address] [facility] Pager, [phone]

## 2022-05-13 NOTE — ED Provider Notes (Addendum)
Emergency Medicine Observation Re-evaluation Note  Walter Howe is a 36 y.o. adult, seen on rounds today.  Pt initially presented to the ED for complaints of IVC Currently, the patient is patient sent to emergency department from behavioral health urgent care for medical clearance.  Patient was IVC at the urgent care.  Per the IVC paperwork patient has a history of schizoaffective disorder bipolar type noncompliance with medications.  Patient is medically cleared and awaiting behavioral health assessment.  Physical Exam  BP 113/69 (BP Location: Right Arm)   Pulse 94   Temp 98.3 F (36.8 C) (Oral)   Resp 20   Ht 1.702 m (5\' 7" )   Wt 50 kg   SpO2 96%   BMI 17.26 kg/m  Physical Exam General: Alert and up in about Cardiac: RR Lungs: No acute pulmonary distress Psych: Alert baseline  ED Course / MDM  EKG:EKG Interpretation  Date/Time:  Thursday May 12 2022 17:24:30 EST Ventricular Rate:  113 PR Interval:  118 QRS Duration: 90 QT Interval:  334 QTC Calculation: 458 R Axis:   82 Text Interpretation: Sinus tachycardia Minimal voltage criteria for LVH, may be normal variant ( Sokolow-Lyon ) Nonspecific ST abnormality Abnormal ECG When compared with ECG of 19-Dec-2019 01:55, No significant change since last tracing Confirmed by 21-Dec-[phone]) on 05/12/2022 9:06:11 PM  I have reviewed the labs performed to date as well as medications administered while in observation.  Recent changes in the last 24 hours include nothing significant.  Plan  Current plan is for awaiting TTS eval.  Patient is IVC.    05/14/2022, MD 05/13/22 1155  Patient has been recommended for inpatient facility.  Placement pending.    05/15/22, MD 05/13/22 1155

## 2022-05-13 NOTE — Progress Notes (Signed)
Pt is a 36 yo male who was involuntarily admitted to room 505-1 due to medication non-compliance, bizarre behavior at home and agitation.  Pt admits to not being compliant with his medications and "losing touch" with provider Otila Back at [facility].  Pt says he lives with father who is supportive.  Pt presents with disorganized speech and some paranoia.  Pt signed admission paperwork without difficult and is currently on the unit. Pt vapes daily and denies using all other substances.  Pt is adjusting well on the unit at this time.  RN will continue to monitor pt's progress and support as needed.

## 2022-05-13 NOTE — ED Notes (Signed)
Patient discharged via [employer] x 2 officers to [facility].

## 2022-05-13 NOTE — ED Provider Notes (Signed)
Patient excepted by [facility].  Bed is available.   Vanetta Mulders, MD 05/13/22 [phone]

## 2022-05-13 NOTE — Progress Notes (Signed)
[facility] Psych ED Progress Note  05/13/2022 10:22 AM Walter Howe  MRN:  [id number]   Principal Problem: Schizoaffective disorder, bipolar type [facility]) Diagnosis:  Principal Problem:   Schizoaffective disorder, bipolar type (HCC) Active Problems:   Generalized anxiety disorder   ED Assessment Time Calculation: Start Time: 0945 Stop Time: 1000 Total Time in Minutes (Assessment Completion): 15   HPI:  Per admission note Walter Howe is a 36 y.o. adult patient is here at [facility] for medical clearance, he was IVC'd at [facility] by staff. Per IVC paperwork patient has a history of schizoaffective disorder, bipolar type and has been noncompliant with his medications.  Per father, per IVC paperwork he reports the patient has had increased bizarre behavior and it has been concerning for the past 2 weeks.  Reports that he is misusing his clonazepam as as prescribed by outpatient psychiatry.  He takes all of those in a few days, per father he needs a higher dose.  Per patient's father reports that he misuses diphenhydramine and the patient's been ingesting up to 25 tablets of 25 mg of diphenhydramine daily.    Subjective: Walter Howe, 36 y.o., adult patient seen face to face by this provider, and chart reviewed on 05/13/22. On evaluation Walter Howe reports that he feels much better, affect appears to be brighter, he is smiling. Provider informed him that father Walter Howe was spoken to, and that provider let father know patient was safe. Patient stated that he was happy that his father knows where he is and that he is safe. Patient was not able to tell exactly where he was, he knew he was in [place] and that Christmas was coming up. Patient denies SI/HI/VH, but when asked about visual hallucinations he stated, "I would rather not say, that's it for all", then he began pulling his covers up around him, saying he was going to get more rest. Provider asked if he was having visual hallucinations did he  feel safe, and he stated he was fine. Provider let him know that if he wanted to call his father and speak with him he could, patient smiled.    During evaluation Walter Howe is laying in his bed in no acute distress. He is alert, oriented to city and upcoming holiday, and his name, calm, cooperative and attentive. His mood is euthymic with a brighter affect than previous day. He has normal speech, and behavior. At some point there seems to be some thought blocking as patient seems careful as to what to say. He does not want to discuss his visual hallucinations, and continues to state "I just want a fresh start". Objectively there is no evidence of psychosis/mania.   Spoke with patient father who is at work, informed him that patient has been recommended to an inpatient facility, and we will keep him updated.   Past Psychiatric History: Schizoaffective d/o, anxiety and ADHD   [place] Scale:  Flowsheet Row Office Visit from 09/14/2021 in [facility] Office Visit from 07/13/2021 in [facility] Video Visit from 01/04/2021 in [facility]  C-SSRS RISK CATEGORY No Risk Low Risk No Risk       Past Medical History:  Past Medical History:  Diagnosis Date   Anxiety    03/05/2019- in the past, not current   History of kidney stones    passed    Past Surgical History:  Procedure Laterality Date   NO PAST SURGERIES  ORIF HUMERUS FRACTURE Right 03/07/2019   Procedure: OPEN REDUCTION INTERNAL FIXATION (ORIF) RIGHT HUMERUS;  Surgeon: Tarry Kos, MD;  Location: [facility] OR;  Service: Orthopedics;  Laterality: Right;   Family History: No family history on file.   Social History:  Social History   Substance and Sexual Activity  Alcohol Use Yes   Comment: ocassional     Social History   Substance and Sexual Activity  Drug Use Yes   Comment: vicodin    Social History   Socioeconomic History   Marital  status: Single    Spouse name: Not on file   Number of children: Not on file   Years of education: Not on file   Highest education level: Not on file  Occupational History   Not on file  Tobacco Use   Smoking status: Former    Types: Cigarettes    Quit date: 02/20/2019    Years since quitting: 3.2   Smokeless tobacco: Never  Vaping Use   Vaping Use: Every day   Devices: used i n attempt to stop smoking- did n ot work , quit vaping- 2019  Substance and Sexual Activity   Alcohol use: Yes    Comment: ocassional   Drug use: Yes    Comment: vicodin   Sexual activity: Not on file  Other Topics Concern   Not on file  Social History Narrative   Not on file   Social Determinants of Health   Financial Resource Strain: Not on file  Food Insecurity: Not on file  Transportation Needs: Not on file  Physical Activity: Not on file  Stress: Not on file  Social Connections: Not on file    Sleep: Good  Appetite:  Fair  Current Medications: Current Facility-Administered Medications  Medication Dose Route Frequency Provider Last Rate Last Admin   clonazePAM (KLONOPIN) tablet 0.5 mg  0.5 mg Oral BID PRN Achille Rich, PA-C   0.5 mg at 05/12/22 2303   divalproex (DEPAKOTE) DR tablet 250 mg  250 mg Oral TID Achille Rich, PA-C   250 mg at 05/13/22 1003   QUEtiapine (SEROQUEL) tablet 50 mg  50 mg Oral QHS Achille Rich, PA-C   50 mg at 05/12/22 2303   Current Outpatient Medications  Medication Sig Dispense Refill   busPIRone (BUSPAR) 15 MG tablet Take 1 tablet (15 mg total) by mouth 3 (three) times daily. 90 tablet 3   clonazePAM (KLONOPIN) 0.5 MG tablet Take 1 tablet (0.5 mg total) by mouth 2 (two) times daily as needed for anxiety. 60 tablet 1   divalproex (DEPAKOTE) 250 MG DR tablet Take 1 tablet (250 mg total) by mouth 3 (three) times daily. 90 tablet 3   FLUoxetine (PROZAC) 40 MG capsule Take 2 capsules (80 mg total) by mouth daily. 60 capsule 1   hydrOXYzine (ATARAX/VISTARIL) 25 MG  tablet Take 1 tablet (25 mg total) by mouth 3 (three) times daily as needed for anxiety. 75 tablet 0   QUEtiapine (SEROQUEL) 50 MG tablet Take 1 tablet (50 mg total) by mouth at bedtime. 30 tablet 3    Lab Results:  Results for orders placed or performed during the hospital encounter of 05/12/22 (from the past 48 hour(s))  Comprehensive metabolic panel     Status: Abnormal   Collection Time: 05/12/22  5:30 PM  Result Value Ref Range   Sodium 138 135 - 145 mmol/L   Potassium 4.4 3.5 - 5.1 mmol/L   Chloride 103 98 - 111 mmol/L   CO2  24 22 - 32 mmol/L   Glucose, Bld 87 70 - 99 mg/dL    Comment: Glucose reference range applies only to samples taken after fasting for at least 8 hours.   BUN 16 6 - 20 mg/dL   Creatinine, Ser 1.611.01 0.61 - 1.24 mg/dL   Calcium 9.5 8.9 - 09.610.3 mg/dL   Total Protein 8.2 (H) 6.5 - 8.1 g/dL   Albumin 4.7 3.5 - 5.0 g/dL   AST 23 15 - 41 U/L   ALT 13 0 - 44 U/L   Alkaline Phosphatase 60 38 - 126 U/L   Total Bilirubin 0.7 0.3 - 1.2 mg/dL   GFR, Estimated >04>60 >54>60 mL/min    Comment: (NOTE) Calculated using the CKD-EPI Creatinine Equation (2021)    Anion gap 11 5 - 15    Comment: Performed at [facility], [address] [place], [place] [address]  Ethanol     Status: None   Collection Time: 05/12/22  5:30 PM  Result Value Ref Range   Alcohol, Ethyl (B) <10 <10 mg/dL    Comment: (NOTE) Lowest detectable limit for serum alcohol is 10 mg/dL.  For medical purposes only. Performed at [facility], [address] [place], [place] [address]   CBC with Diff     Status: None   Collection Time: 05/12/22  5:30 PM  Result Value Ref Range   WBC 8.3 4.0 - 10.5 K/uL   RBC 5.05 4.22 - 5.81 MIL/uL   Hemoglobin 14.8 13.0 - 17.0 g/dL   HCT 29.544.9 62.139.0 - 30.852.0 %   MCV 88.9 80.0 - 100.0 fL   MCH 29.3 26.0 - 34.0 pg   MCHC 33.0 30.0 - 36.0 g/dL   RDW 65.713.3 84.611.5 - 96.215.5 %   Platelets 359 150 - 400 K/uL   nRBC 0.0 0.0 - 0.2 %    Neutrophils Relative % 66 %   Neutro Abs 5.4 1.7 - 7.7 K/uL   Lymphocytes Relative 28 %   Lymphs Abs 2.4 0.7 - 4.0 K/uL   Monocytes Relative 6 %   Monocytes Absolute 0.5 0.1 - 1.0 K/uL   Eosinophils Relative 0 %   Eosinophils Absolute 0.0 0.0 - 0.5 K/uL   Basophils Relative 0 %   Basophils Absolute 0.0 0.0 - 0.1 K/uL   Immature Granulocytes 0 %   Abs Immature Granulocytes 0.02 0.00 - 0.07 K/uL    Comment: Performed at [facility], [address] [place], [place] [address]  Salicylate level     Status: Abnormal   Collection Time: 05/12/22  5:30 PM  Result Value Ref Range   Salicylate Lvl <7.0 (L) 7.0 - 30.0 mg/dL    Comment: Performed at [facility], [address] [place], [place] [address]  Acetaminophen level     Status: Abnormal   Collection Time: 05/12/22  5:30 PM  Result Value Ref Range   Acetaminophen (Tylenol), Serum <10 (L) 10 - 30 ug/mL    Comment: (NOTE) Therapeutic concentrations vary significantly. A range of 10-30 ug/mL  may be an effective concentration for many patients. However, some  are best treated at concentrations outside of this range. Acetaminophen concentrations >150 ug/mL at 4 hours after ingestion  and >50 ug/mL at 12 hours after ingestion are often associated with  toxic reactions.  Performed at [facility], [address] [place], [place] [address]   I-Stat Beta hCG blood, ED ([facility], [facility], [facility] only)  Status: None   Collection Time: 05/12/22  5:37 PM  Result Value Ref Range   I-stat hCG, quantitative <5.0 <5 mIU/mL   Comment 3            Comment:   GEST. AGE      CONC.  (mIU/mL)   <=1 WEEK        5 - 50     2 WEEKS       50 - 500     3 WEEKS       100 - 10,000     4 WEEKS     1,000 - 30,000        MALE AND NON-PREGNANT MALE:     LESS THAN 5 mIU/mL   Urine rapid drug screen (hosp performed)     Status: None   Collection Time: 05/12/22  8:05 PM  Result Value Ref Range   Opiates  NONE DETECTED NONE DETECTED   Cocaine NONE DETECTED NONE DETECTED   Benzodiazepines NONE DETECTED NONE DETECTED   Amphetamines NONE DETECTED NONE DETECTED   Tetrahydrocannabinol NONE DETECTED NONE DETECTED   Barbiturates NONE DETECTED NONE DETECTED    Comment: (NOTE) DRUG SCREEN FOR MEDICAL PURPOSES ONLY.  IF CONFIRMATION IS NEEDED FOR ANY PURPOSE, NOTIFY LAB WITHIN 5 DAYS.  LOWEST DETECTABLE LIMITS FOR URINE DRUG SCREEN Drug Class                     Cutoff (ng/mL) Amphetamine and metabolites    1000 Barbiturate and metabolites    200 Benzodiazepine                 200 Opiates and metabolites        300 Cocaine and metabolites        300 THC                            50 Performed at [facility], [address] [place], [place] [address]   Resp panel by RT-PCR (RSV, Flu A&B, Covid) Anterior Nasal Swab     Status: None   Collection Time: 05/13/22  4:17 AM   Specimen: Anterior Nasal Swab  Result Value Ref Range   SARS Coronavirus 2 by RT PCR NEGATIVE NEGATIVE    Comment: (NOTE) SARS-CoV-2 target nucleic acids are NOT DETECTED.  The SARS-CoV-2 RNA is generally detectable in upper respiratory specimens during the acute phase of infection. The lowest concentration of SARS-CoV-2 viral copies this assay can detect is 138 copies/mL. A negative result does not preclude SARS-Cov-2 infection and should not be used as the sole basis for treatment or other patient management decisions. A negative result may occur with  improper specimen collection/handling, submission of specimen other than nasopharyngeal swab, presence of viral mutation(s) within the areas targeted by this assay, and inadequate number of viral copies(<138 copies/mL). A negative result must be combined with clinical observations, patient history, and epidemiological information. The expected result is Negative.  Fact Sheet for Patients:  BloggerCourse.com  Fact Sheet  for Healthcare Providers:  SeriousBroker.it  This test is no t yet approved or cleared by the [place] FDA and  has been authorized for detection and/or diagnosis of SARS-CoV-2 by FDA under an Emergency Use Authorization (EUA). This EUA will remain  in effect (meaning this test can be used) for the duration of the COVID-19 declaration under Section 564(b)(1) of the Act, 21 U.S.C.section 360bbb-3(b)(1), unless the authorization  is terminated  or revoked sooner.       Influenza A by PCR NEGATIVE NEGATIVE   Influenza B by PCR NEGATIVE NEGATIVE    Comment: (NOTE) The Xpert Xpress SARS-CoV-2/FLU/RSV plus assay is intended as an aid in the diagnosis of influenza from Nasopharyngeal swab specimens and should not be used as a sole basis for treatment. Nasal washings and aspirates are unacceptable for Xpert Xpress SARS-CoV-2/FLU/RSV testing.  Fact Sheet for Patients: BloggerCourse.com  Fact Sheet for Healthcare Providers: SeriousBroker.it  This test is not yet approved or cleared by the [place] FDA and has been authorized for detection and/or diagnosis of SARS-CoV-2 by FDA under an Emergency Use Authorization (EUA). This EUA will remain in effect (meaning this test can be used) for the duration of the COVID-19 declaration under Section 564(b)(1) of the Act, 21 U.S.C. section 360bbb-3(b)(1), unless the authorization is terminated or revoked.     Resp Syncytial Virus by PCR NEGATIVE NEGATIVE    Comment: (NOTE) Fact Sheet for Patients: BloggerCourse.com  Fact Sheet for Healthcare Providers: SeriousBroker.it  This test is not yet approved or cleared by the [place] FDA and has been authorized for detection and/or diagnosis of SARS-CoV-2 by FDA under an Emergency Use Authorization (EUA). This EUA will remain in effect (meaning this test can be  used) for the duration of the COVID-19 declaration under Section 564(b)(1) of the Act, 21 U.S.C. section 360bbb-3(b)(1), unless the authorization is terminated or revoked.  Performed at [facility], [address], [place], [place] [address]     Blood Alcohol level:  Lab Results  Component Value Date   [facility] <10 05/12/2022   ETH <10 12/19/2019    Physical Findings:  CIWA:    COWS:     Musculoskeletal: Strength & Muscle Tone: within normal limits Gait & Station: normal Patient leans: N/A  Psychiatric Specialty Exam:  Presentation  General Appearance:  Appropriate for Environment  Eye Contact: Fleeting  Speech: Clear and Coherent  Speech Volume: Decreased  Handedness: Right   Mood and Affect  Mood: Anxious; Euthymic  Affect: Appropriate   Thought Process  Thought Processes: Disorganized  Descriptions of Associations:Loose  Orientation:Partial  Thought Content:WDL  History of Schizophrenia/Schizoaffective disorder:Yes  Duration of Psychotic Symptoms:Greater than six months  Hallucinations:Hallucinations: Visual Description of Visual Hallucinations: he would not say  Ideas of Reference:None  Suicidal Thoughts:Suicidal Thoughts: No  Homicidal Thoughts:Homicidal Thoughts: No   Sensorium  Memory: Immediate Fair; Remote Fair  Judgment: Fair  Insight: Fair   Art therapist  Concentration: Fair  Attention Span: Fair  Recall: [employer] of Knowledge: Fair  Language: Fair   Psychomotor Activity  Psychomotor Activity: Psychomotor Activity: Normal   Assets  Assets: Housing; Social Support   Sleep  Sleep: Sleep: Good Number of Hours of Sleep: 12    Physical Exam: Physical Exam Vitals and nursing note reviewed.  HENT:     Nose: Nose normal.  Cardiovascular:     Rate and Rhythm: Normal rate.  Pulmonary:     Effort: Pulmonary effort is normal.  Neurological:     Mental Status: He  is alert.  Psychiatric:        Attention and Perception: He perceives auditory hallucinations.        Mood and Affect: Affect normal. Mood is anxious.        Speech: Speech normal.        Behavior: Behavior is cooperative.        Thought Content: Thought  content is delusional.    Review of Systems  Respiratory: Negative.    Genitourinary: Negative.   Musculoskeletal: Negative.   Skin: Negative.   Psychiatric/Behavioral:  Positive for hallucinations.    Blood pressure 113/69, pulse 94, temperature 98.3 F (36.8 C), temperature source Oral, resp. rate 20, height  (1.702 m), weight 50 kg, SpO2 96 %. Body mass index is 17.26 kg/m.   Medical Decision Making: Patient continues to require psychiatric inpatient. Currently under review at [facility].       Kiyomi Pallo MOTLEY-MANGRUM, PMHNP 05/13/2022, 10:22 AM

## 2022-05-13 NOTE — ED Notes (Signed)
[facility]S/[facility] notified that the patient needed to be transported to [facility] due to IVC paperwork.

## 2022-05-13 NOTE — Tx Team (Signed)
Initial Treatment Plan 05/13/2022 2:14 PM Walter Howe ZPH:[id number]    PATIENT STRESSORS: Medication change or noncompliance     PATIENT STRENGTHS: Religious Affiliation  Supportive family/friends    PATIENT IDENTIFIED PROBLEMS: anxiety  Non adherence to medication                   DISCHARGE CRITERIA:  Improved stabilization in mood, thinking, and/or behavior  PRELIMINARY DISCHARGE PLAN: Outpatient therapy  PATIENT/FAMILY INVOLVEMENT: This treatment plan has been presented to and reviewed with the patient, [name].  The patient has been given the opportunity to ask questions and make suggestions.  Garnette Scheuermann, RN 05/13/2022, 2:14 PM

## 2022-05-14 DIAGNOSIS — F250 Schizoaffective disorder, bipolar type: Secondary | ICD-10-CM | POA: Diagnosis not present

## 2022-05-14 DIAGNOSIS — F1320 Sedative, hypnotic or anxiolytic dependence, uncomplicated: Secondary | ICD-10-CM | POA: Insufficient documentation

## 2022-05-14 MED ORDER — NICOTINE POLACRILEX 2 MG MT GUM
CHEWING_GUM | OROMUCOSAL | Status: AC
  Filled 2022-05-14: qty 1

## 2022-05-14 MED ORDER — CLONAZEPAM 0.25 MG PO TBDP
0.5000 mg | ORAL_TABLET | Freq: Every day | ORAL | Status: AC
  Administered 2022-05-14 – 2022-05-15 (×2): 0.5 mg via ORAL
  Filled 2022-05-14 (×2): qty 2

## 2022-05-14 MED ORDER — DIVALPROEX SODIUM 500 MG PO DR TAB
500.0000 mg | DELAYED_RELEASE_TABLET | Freq: Two times a day (BID) | ORAL | Status: DC
  Administered 2022-05-14 – 2022-05-17 (×6): 500 mg via ORAL
  Filled 2022-05-14 (×8): qty 1

## 2022-05-14 MED ORDER — QUETIAPINE FUMARATE 25 MG PO TABS
25.0000 mg | ORAL_TABLET | Freq: Every day | ORAL | Status: DC
  Filled 2022-05-14 (×2): qty 1

## 2022-05-14 MED ORDER — NICOTINE POLACRILEX 2 MG MT GUM
2.0000 mg | CHEWING_GUM | OROMUCOSAL | Status: DC | PRN
  Administered 2022-05-15 – 2022-05-16 (×2): 2 mg via ORAL
  Filled 2022-05-14 (×3): qty 1

## 2022-05-14 MED ORDER — HYDROXYZINE HCL 25 MG PO TABS
25.0000 mg | ORAL_TABLET | Freq: Three times a day (TID) | ORAL | Status: DC | PRN
  Administered 2022-05-16 (×2): 25 mg via ORAL
  Filled 2022-05-14 (×2): qty 1

## 2022-05-14 MED ORDER — RISPERIDONE 1 MG PO TBDP
1.0000 mg | ORAL_TABLET | Freq: Two times a day (BID) | ORAL | Status: DC
  Administered 2022-05-14 – 2022-05-17 (×6): 1 mg via ORAL
  Filled 2022-05-14 (×10): qty 1

## 2022-05-14 MED ORDER — TRAZODONE HCL 50 MG PO TABS
50.0000 mg | ORAL_TABLET | Freq: Every evening | ORAL | Status: DC | PRN
  Administered 2022-05-16: 50 mg via ORAL

## 2022-05-14 NOTE — Progress Notes (Signed)
Pt is A&OX4, calm, guarded, isolative, cooperative, denies suicidal ideations, denies homicidal ideations, denies auditory hallucinations and denies visual hallucinations. Pt verbally agrees to approach staff if these become apparent and before harming self or others. Pt denies experiencing nightmares. Mood and affect are congruent. Pt appetite is ok. No complaints of anxiety, distress, pain and/or discomfort at this time. Pt's memory appears to be grossly intact, and Pt hasn't displayed any injurious behaviors. Pt is medication compliant. There's no evidence of suicidal intent. Psychomotor activity was WNL. No s/s of Parkinson, Dystonia, Akathisia and/or Tardive Dyskinesia noted.
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
## 2022-05-14 NOTE — Progress Notes (Signed)
   05/14/22 2200  Psych Admission Type (Psych Patients Only)  Admission Status Involuntary  Psychosocial Assessment  Patient Complaints Anxiety  Eye Contact Fair  Facial Expression Anxious  Affect Anxious  Speech Soft  Interaction Cautious  Motor Activity Slow  Appearance/Hygiene In scrubs;Disheveled  Behavior Characteristics Anxious  Mood Anxious  Thought Process  Coherency Disorganized  Content Paranoia  Delusions WDL  Perception WDL  Hallucination None reported or observed  Judgment WDL  Confusion WDL  Danger to Self  Current suicidal ideation? Denies  Danger to Others  Danger to Others None reported or observed
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
## 2022-05-14 NOTE — [facility] Suicide Risk Assessment (Addendum)
Suicide Risk Assessment  Admission Assessment    [facility] Admission Suicide Risk Assessment   Nursing information obtained from:  Patient Demographic factors:  Male, Caucasian, Unemployed Current Mental Status:  NA Loss Factors:  Decrease in vocational status Historical Factors:  Impulsivity Risk Reduction Factors:  Positive social support  Total Time spent with patient: 45 minutes Principal Problem: Schizophrenia ([place]) Diagnosis:  Principal Problem:   Schizophrenia ([place])   Subjective Data:   Initial assessment on 12/23, patient was evaluated on the inpatient unit, the patient reports Going to behavioral health urgent center to make an appointment and get his medications straightened out.  He is unclear as to why he was brought to [facility] emergency department and then to [facility].  When confronted regarding the chart review stating that he was taking high amount of Klonopin and Benadryl a day, he reports that he was taking his Klonopin a little more than what was prescribed due to anxiety.  He denies taking Benadryl more than what is needed he was taking this for anxiety.  He denies overdosing on these medications.  Denies SI, HI, and AVH.  He notes in the past he would have auditory hallucinations but was evasive when asked any specifics.  Denies having visual hallucinations in the past.  In general, he was evasive regarding psychotic features.  Would pause at times and look around the room then would respond.  Denied feelings of paranoia, persecutory delusions, ideas of reference, and thought broadcasting.  He reports having high anxiety which she has had for years.  He denies having symptoms of depression including low mood, insomnia, anhedonia, feelings of guilt, and poor appetite.  He does endorse poor energy and concentration.  He denies ever having SI or history of self-harm.  Denies symptoms of mania including increased mood or energy in the context of decreased sleep.  Denies symptoms of PTSD  including nightmares and avoidance.   Continued Clinical Symptoms:  Alcohol Use Disorder Identification Test Final Score (AUDIT): 0 The "Alcohol Use Disorders Identification Test", Guidelines for Use in Primary Care, Second Edition.  World Pharmacologist [facility]). Score between 0-7:  no or low risk or alcohol related problems. Score between 8-15:  moderate risk of alcohol related problems. Score between 16-19:  high risk of alcohol related problems. Score 20 or above:  warrants further diagnostic evaluation for alcohol dependence and treatment.   CLINICAL FACTORS:   Alcohol/Substance Abuse/Dependencies Schizophrenia:   Paranoid or undifferentiated type   Musculoskeletal: Strength & Muscle Tone: within normal limits Gait & Station: normal Patient leans: N/A  Mental Status Exam   General Appearance: appears at stated age, in hospital scrubs   Behavior: somewhat guarded    Psychomotor Activity: no psychomotor agitation or retardation noted   Eye Contact: fair Speech: normal amount, tone, volume and fluency      Mood: dysphoric Affect: congruent   Thought Process: linear, no circumstantial or tangential thought process noted, no racing thoughts or flight of ideas  Descriptions of Associations: intact  Thought Content:  Hallucinations: denies AH, VH , does not appear responding to stimuli  Delusions: no paranoia, delusions of control, grandeur, ideas of reference, thought broadcasting, and magical thinking  Suicidal Thoughts: denies SI, intention, plan  Homicidal Thoughts: denies HI, intention, plan    Alertness/Orientation: alert and fully oriented    Insight: limited Judgment: limited   Memory: limited   Executive Functions  Concentration: limited Attention Span: limited Recall: intact  Fund of Knowledge: fair  Sleep  Sleep: Sleep: Good     Physical Exam  Constitutional:      Appearance: Normal appearance.  Cardiovascular:     Rate and Rhythm: Normal  rate.  Pulmonary:     Effort: Pulmonary effort is normal.  Neurological:     General: No focal deficit present.     Mental Status: Alert and oriented to person, place, and time.      Review of Systems  Constitutional: Negative.  Negative for chills, fever and weight loss.  HENT: Negative.    Eyes: Negative.   Respiratory: Negative.    Cardiovascular: Negative.   Gastrointestinal:  Negative for constipation, diarrhea, nausea and vomiting.  Genitourinary: Negative.   Musculoskeletal: Negative.   Skin: Negative.   Neurological: Negative.  Negative for tingling.    COGNITIVE FEATURES THAT CONTRIBUTE TO RISK:  Closed-mindedness    SUICIDE RISK:  Mild:  Unclear Suicidal ideation of limited frequency, intensity, duration, and specificity.  There are no identifiable plans, no associated intent, mild dysphoria and related symptoms, good self-control (both objective and subjective assessment), few other risk factors, and identifiable protective factors, including available and accessible social support.  PLAN OF CARE: See H&P for assessment and plan.   I certify that inpatient services furnished can reasonably be expected to improve the patient's condition.   Lance Muss, MD 05/14/2022, 12:49 PM

## 2022-05-14 NOTE — Group Note (Signed)
LCSW Group Therapy Note  05/14/2022      Type of Therapy and Topic:  Group Therapy: Gratitude   Description:   Group could not be held by social worker, but licensed RN did provide group.  A handout was given to each patient, with the following information:   Gratitude  "Acknowledging the good that you already have in your life is the foundation for all abundance." - Eckhart Tolle  " 'Enough' is a feast." - Buddhist Proverb  "Gratitude sweetens even the smallest moments."  "It is not joy that makes us grateful; It is gratitude that makes us joyful." - David Steindl-Rast    Put at least one response under each category of something for which you are grateful:  People:  Experiences:  Things:  Places:  Skills:  Other:  Add more responses as you get ideas from other people.   Therapeutic Modalities:   Activity  Harpreet Signore J Grossman-Orr, LCSW   

## 2022-05-14 NOTE — Progress Notes (Signed)
The patient rated their day as a 5 out of 10 since he rested quite a bit. Patient admits to having "low energy". The patient's goal for tomorrow is to have a better day.

## 2022-05-14 NOTE — Progress Notes (Signed)
   05/14/22 0555  15 Minute Checks  Location Bedroom  Visual Appearance Calm  Behavior Composed  Sleep (Behavioral Health Patients Only)  Calculate sleep? (Click Yes once per 24 hr at 0600 safety check) Yes  Documented sleep last 24 hours 10.25

## 2022-05-15 DIAGNOSIS — F250 Schizoaffective disorder, bipolar type: Secondary | ICD-10-CM | POA: Diagnosis not present

## 2022-05-15 LAB — TSH: TSH: 1.377 u[IU]/mL (ref 0.350–4.500)

## 2022-05-15 LAB — LIPID PANEL
Cholesterol: 209 mg/dL — ABNORMAL HIGH (ref 0–200)
HDL: 51 mg/dL (ref >40)
LDL Cholesterol: 131 mg/dL — ABNORMAL HIGH (ref 0–99)
Total CHOL/HDL Ratio: 4.1 RATIO
Triglycerides: 137 mg/dL (ref <150)
VLDL: 27 mg/dL (ref 0–40)

## 2022-05-15 LAB — SARS CORONAVIRUS 2 BY RT PCR: SARS Coronavirus 2 by RT PCR: NEGATIVE

## 2022-05-15 NOTE — Progress Notes (Signed)
Patient denies SI, HI and AVH this shift. Patient has had no incidents of behavioral dyscontrol this shift. Patient denies all somatic complaints.   Assess patient for safety, offer medications as prescribed, engage patient in 1:1 therapeutic staff talks.   Continue to monitor as planned. Patient able to contract for safety.

## 2022-05-15 NOTE — Progress Notes (Signed)
   05/15/22 0657  15 Minute Checks  Location Bedroom  Visual Appearance Calm  Behavior Composed  Sleep (Behavioral Health Patients Only)  Calculate sleep? (Click Yes once per 24 hr at 0600 safety check) Yes  Documented sleep last 24 hours 8

## 2022-05-15 NOTE — Group Note (Signed)
[facility] LCSW Group Therapy Note  Date/Time:  05/15/2022   Type of Therapy and Topic:  Group could not be held today.  A handout to read on the topic of developing a healthy support system was given to each patient.  They were encouraged to discuss this with their peers.  Handout:  A handout entitled "Developing Your Support System" was provided.  This handout discusses the benefits of a social support system, how to sustain current relationships, some ideas for building a social support system, and why it is important to cultivate your social support system now.  Therapeutic Modalities:   Handout  Dessirae Scarola Grossman-Orr, LCSW        

## 2022-05-15 NOTE — Progress Notes (Signed)
[name]  05/15/2022 7:16 AM Walter Howe  MRN:  [id number]   Reason for Admission:  Walter Howe is a 36 y.o. adult with a history of  schizoaffective disorder, bipolar type, who was initially admitted for inpatient psychiatric hospitalization on 05/13/2022 for management of psychosis in the setting of medication noncompliance. The patient is currently on Hospital Day 2.   Chart Review from last 24 hours:  The patient's chart was reviewed and nursing notes were reviewed. The patient's case was discussed in multidisciplinary team meeting. Per [facility], patient was taking medications appropriately. Per nursing, patient is calm and cooperative and attended 0 groups. Patient received the following PRN medications: none  Information Obtained Today During Patient Interview: The patient was seen and evaluated on the unit. On assessment today the patient reports doing well this morning. He notes sleeping well and having good appetite. His mood is "okay". He denies noticing adverse effects from restarting depakote and changing to risperdal. He is agreeable to this medication combination. His goal today is to attend more group sessions.     Principal Problem: Schizoaffective disorder, bipolar type (H[facility]) Diagnosis: Principal Problem:   Schizoaffective disorder, bipolar type ([facility]) Active Problems:   Benzodiazepine dependence (H[facility])    Past Psychiatric History:  Previous Psych Diagnoses: Schizoaffective d/o bipolar type, anxiety and ADHD  Current outpatient psychiatric treatment:  Buspar 15 mg TID Klonopin 0.5 mg BID PRN Depakote 250 mg TID Prozac 80 mg Atarax 25 mg TID PRN Seroquel 50 mg QHS   Current psychiatrist: [facility]   Previous hospitalizations: once, years ago and unsure why   Previous psychiatric treatment: In 05/2020,  Olanzapine 20 mg at bedtime Trazodone 50 mg at bedtime   Psychiatric medication compliance history: Poor Neuromodulation history: Denies Current  therapist: Denies   History of suicide attempts: denies  Past Medical History:  Past Medical History:  Diagnosis Date   Anxiety    03/05/2019- in the past, not current   History of kidney stones    passed    Past Surgical History:  Procedure Laterality Date   NO PAST SURGERIES     ORIF HUMERUS FRACTURE Right 03/07/2019   Procedure: OPEN REDUCTION INTERNAL FIXATION (ORIF) RIGHT HUMERUS;  Surgeon: Tarry Kos, MD;  Location: [facility] OR;  Service: Orthopedics;  Laterality: Right;   Family History: History reviewed. No pertinent family history.  Family History:  Medical: Pt unsure Psych: Mother has ADHD Psych Rx: Pt unsure Suicide: Denies Substance use family hx: Pt unsure   Social History:  Place of birth and grew up where: [place] Abuse: Denies Marital Status: Single Children: Denies Employment: Unemployed Education: 12th grade Housing: lives with father, mother, and sister Legal: 1 DUI Hotel manager: Denies Weapons: Denies  Sleep: fair   Appetite: fair   Current Medications: Current Facility-Administered Medications  Medication Dose Route Frequency Provider Last Rate Last Admin   acetaminophen (TYLENOL) tablet 650 mg  650 mg Oral Q6H PRN Sindy Guadeloupe, NP       alum & mag hydroxide-simeth (MAALOX/MYLANTA) 200-200-20 MG/5ML suspension 30 mL  30 mL Oral Q4H PRN Sindy Guadeloupe, NP       clonazePAM (KLONOPIN) disintegrating tablet 0.5 mg  0.5 mg Oral Daily Kizzie Ide B, MD   0.5 mg at 05/14/22 1221   divalproex (DEPAKOTE) DR tablet 500 mg  500 mg Oral BID Kizzie Ide B, MD   500 mg at 05/14/22 1759   hydrOXYzine (ATARAX) tablet 25 mg  25 mg Oral TID PRN  Lance MussHoang, Ronella Plunk B, MD       magnesium hydroxide (MILK OF MAGNESIA) suspension 30 mL  30 mL Oral Daily PRN Sindy GuadeloupeWilliams, Roy, NP       nicotine polacrilex (NICORETTE) gum 2 mg  2 mg Oral PRN Abbott PaoAttiah, Nadir, MD       risperiDONE (RISPERDAL M-TABS) disintegrating tablet 1 mg  1 mg Oral BID Kizzie IdeHoang, Chrishawn [place]oley B, MD   1 mg at  05/14/22 1758   traZODone (DESYREL) tablet 50 mg  50 mg Oral QHS PRN Lance MussHoang, Smith Potenza B, MD        Lab Results: No results found for this or any previous visit (from the past 48 hour(s)).  Blood Alcohol level:  Lab Results  Component Value Date   ETH <10 05/12/2022   ETH <10 12/19/2019    Metabolic Disorder Labs: Lab Results  Component Value Date   HGBA1C 4.7 (L) 12/19/2019   MPG 88.19 12/19/2019   MPG 93.93 05/27/2019   No results found for: "PROLACTIN" Lab Results  Component Value Date   CHOL 241 (H) 12/19/2019   TRIG 107 12/19/2019   HDL 74 12/19/2019   CHOLHDL 3.3 12/19/2019   VLDL 21 12/19/2019   LDLCALC 146 (H) 12/19/2019   LDLCALC UNABLE TO CALCULATE IF TRIGLYCERIDE OVER 400 mg/dL 57/84/696201/08/2019    Physical Findings: AIMS: Facial and Oral Movements Muscles of Facial Expression: None, normal Lips and Perioral Area: None, normal Jaw: None, normal Tongue: None, normal,Extremity Movements Upper (arms, wrists, hands, fingers): None, normal Lower (legs, knees, ankles, toes): None, normal, Trunk Movements Neck, shoulders, hips: None, normal, Overall Severity Severity of abnormal movements (highest score from questions above): None, normal Incapacitation due to abnormal movements: None, normal Patient's awareness of abnormal movements (rate only patient's report): No Awareness, Dental Status Current problems with teeth and/or dentures?: No Does patient usually wear dentures?: No  CIWA:    COWS:     Musculoskeletal: Strength & Muscle Tone: within normal limits Gait & Station: normal Patient leans: N/A  Psychiatric Specialty Exam:  General Appearance: appears at stated age, in hospital scrubs  Behavior: pleasant and cooperative  Psychomotor Activity: no psychomotor agitation or retardation noted   Eye Contact: fair Speech: normal amount, tone, volume and fluency   Mood: euthymic Affect: congruent, pleasant and interactive  Thought Process: linear, goal  directed, no circumstantial or tangential thought process noted, no racing thoughts or flight of ideas Descriptions of Associations: intact Thought Content: no bizarre content, logical and future-oriented Hallucinations: denies AH, VH , does not appear responding to stimuli Delusions: no paranoia, delusions of control, grandeur, ideas of reference, thought broadcasting, and magical thinking Suicidal Thoughts: denies SI, intention, plan  Homicidal Thoughts: denies HI, intention, plan   Alertness/Orientation: alert and fully oriented  Insight: limited Judgment: fair  Memory: intact  Executive Functions  Concentration: intact  Attention Span: fair Recall: intact Fund of Knowledge: fair    Assets  Housing; Social Support    Physical Exam: Constitutional:      Appearance: Normal appearance.  Cardiovascular:     Rate and Rhythm: Normal rate.  Pulmonary:     Effort: Pulmonary effort is normal.  Neurological:     General: No focal deficit present.     Mental Status: Alert and oriented to person, place, and time.    Review of Systems  Constitutional: Negative.  Negative for chills, fever and weight loss.  HENT: Negative.    Eyes: Negative.   Respiratory: Negative.    Cardiovascular: Negative.  Gastrointestinal:  Negative for constipation, diarrhea, nausea and vomiting.  Genitourinary: Negative.   Musculoskeletal: Negative.   Skin: Negative.   Neurological: Negative.  Negative for tingling.    ASSESSMENT:  Diagnoses / Active Problems: Principal Problem: Schizoaffective disorder, bipolar type (HCC) Diagnosis: Principal Problem:   Schizoaffective disorder, bipolar type (HCC) Active Problems:   Benzodiazepine dependence (HCC)   PLAN: Safety and Monitoring:  -- Involuntary admission to inpatient psychiatric unit for safety, stabilization and treatment  -- Daily contact with patient to assess and evaluate symptoms and progress in treatment  -- Patient's case to be  discussed in multi-disciplinary team meeting  -- Observation Level : q15 minute checks  -- Vital signs:  q12 hours  -- Precautions: suicide, elopement, and assault  2. Medications:    Psychiatric Diagnosis and Treatment Schizoaffective disorder, bipolar type, multiple episodes, currently in acute episode Benzo dependence - Continue Depakote 500 mg twice daily for mood stabilization             - Depakote level and LFT check on 12/26 - Continue Risperdal 1 mg BID for psychotic features  - Klonopin 0.5 mg daily for 2 days due to benzo use, last day 12/24 - Discontinue home Seroquel due to restless leg adverse effect   As needed medications  Tylenol 650 mg every 6 hours as needed for pain Mylanta 30 mL every TID as needed for indigestion Atarax 25 mg every 6 hours as needed for anxiety Milk of magnesia 30 mL daily as needed for constipation Trazodone 50 mg QHS PRN for insomnia   The risks/benefits/side-effects/alternatives to the above medication were discussed in detail with the patient and time was given for questions. The patient consents to medication trial. FDA black box warnings, if present, were discussed.   The patient is agreeable with the medication plan, as above. We will monitor the patient's response to pharmacologic treatment, and adjust medications as necessary.    3. Pertinent labs:  EKG monitoring: QTc: 458 CMP WNL CBC WNL Salicylate and acetaminophen low Alcohol <10  Ordered: A1c, lipid panel, TSH    4. Group and Therapy: -- Encouraged patient to participate in unit milieu and in scheduled group therapies   -- Short Term Goals: Ability to identify changes in lifestyle to reduce recurrence of condition will improve, Ability to verbalize feelings will improve, Ability to disclose and discuss suicidal ideas, Ability to demonstrate self-control will improve, Ability to identify and develop effective coping behaviors will improve, Ability to maintain clinical  measurements within normal limits will improve, Compliance with prescribed medications will improve, and Ability to identify triggers associated with substance abuse/mental health issues will improve  -- Long Term Goals: Improvement in symptoms so as ready for discharge  5. Discharge Planning:   -- Social work and case management to assist with discharge planning and identification of hospital follow-up needs prior to discharge  -- Estimated LOS: 5-7 days  -- Discharge Concerns: Need to establish a safety plan; Medication compliance and effectiveness  -- Discharge Goals: Return home with outpatient referrals for mental health follow-up including medication management/psychotherapy      Total Time Spent in Direct Patient Care:  I personally spent 30 minutes on the unit in direct patient care. The direct patient care time included face-to-face time with the patient, reviewing the patient's chart, communicating with other professionals, and coordinating care. Greater than 50% of this time was spent in counseling or coordinating care with the patient regarding goals of hospitalization, psycho-education, and discharge planning  needs.   Lance Muss, MD PGY-1 Psychiatry 05/15/2022, 7:16 AM

## 2022-05-15 NOTE — Progress Notes (Signed)
   05/15/22 2000  Psych Admission Type (Psych Patients Only)  Admission Status Involuntary  Psychosocial Assessment  Patient Complaints Anxiety  Eye Contact Fair  Facial Expression Anxious  Affect Anxious  Speech Soft  Interaction Cautious;Guarded  Motor Activity Slow  Appearance/Hygiene Disheveled  Behavior Characteristics Cooperative  Mood Anxious  Aggressive Behavior  Effect No apparent injury  Thought Process  Coherency Blocking  Content Paranoia  Delusions WDL  Perception WDL  Hallucination None reported or observed  Judgment WDL  Confusion WDL  Danger to Self  Current suicidal ideation? Denies  Danger to Others  Danger to Others None reported or observed

## 2022-05-15 NOTE — Progress Notes (Signed)
The patient rated his day as a 7 out of 10 since he had a "surprise visitor" today. He did not go into further detail about his day.

## 2022-05-16 ENCOUNTER — Encounter (HOSPITAL_COMMUNITY): Payer: Self-pay

## 2022-05-16 DIAGNOSIS — F250 Schizoaffective disorder, bipolar type: Secondary | ICD-10-CM | POA: Diagnosis not present

## 2022-05-16 NOTE — Group Note (Signed)
Recreation Therapy Group Note   Group Topic:Problem Solving  Group Date: 05/16/2022 Start Time: 1005 End Time: 1050 Facilitators: Korde Jeppsen-McCall, LRT,CTRS Location: [address] Dayroom   Goal Area(s) Addresses:  Patient will effectively work in a team with other group members. Patient will verbalize importance of using appropriate problem solving techniques.  Patient will identify positive change associated with effective problem solving skills.   Group Description: Christmas Codebreakers/Escape Room.  LRT gave patients some worksheets that were Christmas related.  On the first sheet, the letters of the alphabet were numbered.  Patients had to take the letters associated with each number to unlock the words.  On the second sheet, patients had to figure out the numbers hidden within the puzzles provided to get the numbers needed to "escape the room".        Affect/Mood: N/A   Participation Level: Did not attend    Clinical Observations/Individualized Feedback:     Plan: Continue to engage patient in RT group sessions 2-3x/week.   Estelita Iten-McCall, LRT,CTRS 05/16/2022 12:59 PM

## 2022-05-16 NOTE — BH IP Treatment Plan (Signed)
Interdisciplinary Treatment and Diagnostic Plan Update  05/16/2022 Time of Session: 9:10 AM  Walter Howe MRN: [id number]  Principal Diagnosis: Schizoaffective disorder, bipolar type (HCC)  Secondary Diagnoses: Principal Problem:   Schizoaffective disorder, bipolar type (HCC) Active Problems:   Benzodiazepine dependence (HCC)   Current Medications:  Current Facility-Administered Medications  Medication Dose Route Frequency Provider Last Rate Last Admin   acetaminophen (TYLENOL) tablet 650 mg  650 mg Oral Q6H PRN Sindy Guadeloupe, NP       alum & mag hydroxide-simeth (MAALOX/MYLANTA) 200-200-20 MG/5ML suspension 30 mL  30 mL Oral Q4H PRN Sindy Guadeloupe, NP       divalproex (DEPAKOTE) DR tablet 500 mg  500 mg Oral BID Kizzie Ide B, MD   500 mg at 05/16/22 0751   hydrOXYzine (ATARAX) tablet 25 mg  25 mg Oral TID PRN Lance Muss, MD       magnesium hydroxide (MILK OF MAGNESIA) suspension 30 mL  30 mL Oral Daily PRN Sindy Guadeloupe, NP       nicotine polacrilex (NICORETTE) gum 2 mg  2 mg Oral PRN Abbott Pao, Nadir, MD   2 mg at 05/15/22 0748   risperiDONE (RISPERDAL M-TABS) disintegrating tablet 1 mg  1 mg Oral BID Kizzie Ide B, MD   1 mg at 05/16/22 0751   traZODone (DESYREL) tablet 50 mg  50 mg Oral QHS PRN Lance Muss, MD       PTA Medications: Medications Prior to Admission  Medication Sig Dispense Refill Last Dose   busPIRone (BUSPAR) 15 MG tablet Take 1 tablet (15 mg total) by mouth 3 (three) times daily. 90 tablet 3    clonazePAM (KLONOPIN) 0.5 MG tablet Take 1 tablet (0.5 mg total) by mouth 2 (two) times daily as needed for anxiety. 60 tablet 1    divalproex (DEPAKOTE) 250 MG DR tablet Take 1 tablet (250 mg total) by mouth 3 (three) times daily. 90 tablet 3    QUEtiapine (SEROQUEL) 50 MG tablet Take 1 tablet (50 mg total) by mouth at bedtime. 30 tablet 3     Patient Stressors: Medication change or noncompliance    Patient Strengths: Religious Affiliation  Supportive  family/friends   Treatment Modalities: Medication Management, Group therapy, Case management,  1 to 1 session with clinician, Psychoeducation, Recreational therapy.   Physician Treatment Plan for Primary Diagnosis: Schizoaffective disorder, bipolar type (HCC) Long Term Goal(s): Improvement in symptoms so as ready for discharge   Short Term Goals: Ability to identify changes in lifestyle to reduce recurrence of condition will improve Ability to verbalize feelings will improve Ability to disclose and discuss suicidal ideas Ability to demonstrate self-control will improve Ability to identify and develop effective coping behaviors will improve Ability to maintain clinical measurements within normal limits will improve Compliance with prescribed medications will improve Ability to identify triggers associated with substance abuse/mental health issues will improve  Medication Management: Evaluate patient's response, side effects, and tolerance of medication regimen.  Therapeutic Interventions: 1 to 1 sessions, Unit Group sessions and Medication administration.  Evaluation of Outcomes: Not Progressing  Physician Treatment Plan for Secondary Diagnosis: Principal Problem:   Schizoaffective disorder, bipolar type (HCC) Active Problems:   Benzodiazepine dependence (HCC)  Long Term Goal(s): Improvement in symptoms so as ready for discharge   Short Term Goals: Ability to identify changes in lifestyle to reduce recurrence of condition will improve Ability to verbalize feelings will improve Ability to disclose and discuss suicidal ideas Ability to demonstrate self-control will improve Ability  to identify and develop effective coping behaviors will improve Ability to maintain clinical measurements within normal limits will improve Compliance with prescribed medications will improve Ability to identify triggers associated with substance abuse/mental health issues will improve     Medication  Management: Evaluate patient's response, side effects, and tolerance of medication regimen.  Therapeutic Interventions: 1 to 1 sessions, Unit Group sessions and Medication administration.  Evaluation of Outcomes: Not Progressing   RN Treatment Plan for Primary Diagnosis: Schizoaffective disorder, bipolar type (HCC) Long Term Goal(s): Knowledge of disease and therapeutic regimen to maintain health will improve  Short Term Goals: Ability to remain free from injury will improve, Ability to verbalize frustration and anger appropriately will improve, Ability to demonstrate self-control, Ability to participate in decision making will improve, Ability to verbalize feelings will improve, Ability to disclose and discuss suicidal ideas, Ability to identify and develop effective coping behaviors will improve, and Compliance with prescribed medications will improve  Medication Management: RN will administer medications as ordered by provider, will assess and evaluate patient's response and provide education to patient for prescribed medication. RN will report any adverse and/or side effects to prescribing provider.  Therapeutic Interventions: 1 on 1 counseling sessions, Psychoeducation, Medication administration, Evaluate responses to treatment, Monitor vital signs and CBGs as ordered, Perform/monitor CIWA, COWS, AIMS and Fall Risk screenings as ordered, Perform wound care treatments as ordered.  Evaluation of Outcomes: Not Progressing   LCSW Treatment Plan for Primary Diagnosis: Schizoaffective disorder, bipolar type (HCC) Long Term Goal(s): Safe transition to appropriate next level of care at discharge, Engage patient in therapeutic group addressing interpersonal concerns.  Short Term Goals: Engage patient in aftercare planning with referrals and resources, Increase social support, Increase ability to appropriately verbalize feelings, Increase emotional regulation, Facilitate acceptance of mental health  diagnosis and concerns, Facilitate patient progression through stages of change regarding substance use diagnoses and concerns, Identify triggers associated with mental health/substance abuse issues, and Increase skills for wellness and recovery  Therapeutic Interventions: Assess for all discharge needs, 1 to 1 time with Social worker, Explore available resources and support systems, Assess for adequacy in community support network, Educate family and significant other(s) on suicide prevention, Complete Psychosocial Assessment, Interpersonal group therapy.  Evaluation of Outcomes: Not Progressing   Progress in Treatment: Attending groups: No. Participating in groups: No. Taking medication as prescribed: Yes. Toleration medication: Yes. Family/Significant other contact made: Yes, individual(s) contacted:  Jahdiel Krol (father)- [phone] Patient understands diagnosis: No. Discussing patient identified problems/goals with staff: Yes. Medical problems stabilized or resolved: Yes. Denies suicidal/homicidal ideation: Yes. Issues/concerns per patient self-inventory: No.   New problem(s) identified: No, Describe:  None reported   New Short Term/Long Term Goal(s):medication stabilization, elimination of SI thoughts, development of comprehensive mental wellness plan.    Patient Goals:  " medication changes and get my anxiety under control"   Discharge Plan or Barriers: Patient recently admitted. CSW will continue to follow and assess for appropriate referrals and possible discharge planning.    Reason for Continuation of Hospitalization: Aggression Anxiety Medication stabilization Other; describe Misuse of his medications   Estimated Length of Stay: 2-3 days   Last 3 [place] Suicide Severity Risk Score: Flowsheet Row Admission (Current) [facility] Office Visit from 09/14/2021 in [facility] Office Visit  from 07/13/2021 in [facility]  C-SSRS RISK CATEGORY No Risk No Risk Low Risk       Last [facility] 2/9 Scores:  09/14/2021    8:20 AM 07/13/2021    1:59 PM 01/04/2021   12:04 PM  Depression screen PHQ 2/9  Decreased Interest 1 1 1   Down, Depressed, Hopeless 0 0 1  PHQ - 2 Score 1 1 2   Altered sleeping   2  Tired, decreased energy   2  Change in appetite   2  Feeling bad or failure about yourself    2  Trouble concentrating   2  Moving slowly or fidgety/restless   1  Suicidal thoughts   0  PHQ-9 Score   13  Difficult doing work/chores   Somewhat difficult    Scribe for Treatment Team: 05/16/2022 10:27 AM

## 2022-05-16 NOTE — Progress Notes (Signed)
   05/16/22 2100  Psych Admission Type (Psych Patients Only)  Admission Status Involuntary  Psychosocial Assessment  Patient Complaints Anxiety  Eye Contact Fair  Facial Expression Anxious  Affect Anxious  Speech Soft  Interaction Cautious;Guarded  Motor Activity Slow  Appearance/Hygiene Disheveled  Behavior Characteristics Cooperative  Mood Anxious  Aggressive Behavior  Effect No apparent injury  Thought Process  Coherency Blocking  Content Paranoia  Delusions WDL  Perception WDL  Hallucination None reported or observed  Judgment WDL  Confusion WDL  Danger to Self  Current suicidal ideation? Denies  Danger to Others  Danger to Others None reported or observed

## 2022-05-16 NOTE — Progress Notes (Signed)
   05/16/22 0600  15 Minute Checks  Location Bedroom  Visual Appearance Calm  Behavior Sleeping  Sleep (Behavioral Health Patients Only)  Calculate sleep? (Click Yes once per 24 hr at 0600 safety check) Yes  Documented sleep last 24 hours 10.25

## 2022-05-16 NOTE — Plan of Care (Signed)
Patient appropriate on unit. med compliant. Patient denies SI,HI, and A/V/H with no plan/intent. Patient stated he slept well and denies pain. Patient did endorse some anxiety and received atarax po prn which was partially effective in decreasing his anxiety. Patient remains mostly in room through out day but remains cooperative on unit.    Problem: Education: Goal: Mental status will improve Outcome: Progressing   Problem: Activity: Goal: Sleeping patterns will improve Outcome: Progressing   Problem: Health Behavior/Discharge Planning: Goal: Compliance with treatment plan for underlying cause of condition will improve Outcome: Progressing   Problem: Safety: Goal: Periods of time without injury will increase Outcome: Progressing

## 2022-05-16 NOTE — Progress Notes (Signed)
Walter Howe Progress Note  05/16/2022 7:00 AM Walter LopesLeon E Campas  MRN:  [id number]   Reason for Admission:  Walter Howe is a 36 y.o. adult with a history of  schizoaffective disorder, bipolar type, who was initially admitted for inpatient psychiatric hospitalization on 05/13/2022 for management of psychosis in the setting of medication noncompliance. The patient is currently on Hospital Day 3.   Chart Review from last 24 hours:  The patient's chart was reviewed and nursing notes were reviewed. The patient's case was discussed in multidisciplinary team meeting. [facility], patient was taking medications appropriately. Per nursing, patient is calm and cooperative. The following as needed medications were given: nicorette gum 1x   Information Obtained Today During Patient Interview: Patient was seen at bedside this morning. Patient notes feeling "okay" this morning. He reports fair sleep and good appetite. He denies noticing adverse effects from the medications that he is currently taking. Motivationally interviewed regarding Klonopin use and patient notes being interested with other options for anxiety. Agreeable to obtaining Depakote level and LFTs tomorrow. Denies SI, HI, and AVH. Discussed paranoia with the patient and patient agrees that in future situations that are distressing, he will be able to logic his way to not feel so distressed.     Principal Problem: Schizoaffective disorder, bipolar type (HCC) Diagnosis: Principal Problem:   Schizoaffective disorder, bipolar type (HCC) Active Problems:   Benzodiazepine dependence (HCC)    Past Psychiatric History:  Previous Psych Diagnoses: Schizoaffective d/o bipolar type, anxiety and ADHD  Current outpatient psychiatric treatment:  Buspar 15 mg TID Klonopin 0.5 mg BID PRN Depakote 250 mg TID Prozac 80 mg Atarax 25 mg TID PRN Seroquel 50 mg QHS   Current psychiatrist: [facility]   Previous hospitalizations: once, years ago and unsure why    Previous psychiatric treatment: In 05/2020,  Olanzapine 20 mg at bedtime Trazodone 50 mg at bedtime   Psychiatric medication compliance history: Poor Neuromodulation history: Denies Current therapist: Denies   History of suicide attempts: denies  Past Medical History:  Past Medical History:  Diagnosis Date   Anxiety    03/05/2019- in the past, not current   History of kidney stones    passed    Past Surgical History:  Procedure Laterality Date   NO PAST SURGERIES     ORIF HUMERUS FRACTURE Right 03/07/2019   Procedure: OPEN REDUCTION INTERNAL FIXATION (ORIF) RIGHT HUMERUS;  Surgeon: Tarry KosXu, Naiping M, Howe;  Location: [facility] OR;  Service: Orthopedics;  Laterality: Right;   Family History: History reviewed. No pertinent family history.  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 Weapons: Denies   Current Medications: Current Facility-Administered Medications  Medication Dose Route Frequency Provider Last Rate Last Admin   acetaminophen (TYLENOL) tablet 650 mg  650 mg Oral Q6H PRN Walter Howe, Roy, NP       alum & mag hydroxide-simeth (MAALOX/MYLANTA) 200-200-20 MG/5ML suspension 30 mL  30 mL Oral Q4H PRN Walter Howe, Roy, NP       divalproex (DEPAKOTE) DR tablet 500 mg  500 mg Oral BID Walter Howe, Walter Keeven Howe, Howe   500 mg at 05/15/22 1655   hydrOXYzine (ATARAX) tablet 25 mg  25 mg Oral TID PRN Walter Howe,  Walter Hatter, Howe       magnesium hydroxide (MILK OF MAGNESIA) suspension 30 mL  30 mL Oral Daily PRN Walter Gu[place], NP       nicotine polacrilex (NICORETTE) gum 2 mg  2 mg Oral PRN Walter Howe, Nadir, Howe   2 mg at 05/15/22 0748   risperiDONE (RISPERDAL M-TABS) disintegrating  tablet 1 mg  1 mg Oral BID Walter Ide Howe, Howe   1 mg at 05/15/22 1655   traZODone (DESYREL) tablet 50 mg  50 mg Oral QHS PRN Walter Muss, Howe        Lab Results:  Results for orders placed or performed during the hospital encounter of 05/13/22 (from the past 48 hour(s))  SARS Coronavirus 2 by RT PCR (hospital order, performed in [facility] hospital lab) *cepheid single result test* Anterior Nasal Swab     Status: None   Collection Time: 05/15/22  6:30 AM   Specimen: Anterior Nasal Swab  Result Value Ref Range   SARS Coronavirus 2 by RT PCR NEGATIVE NEGATIVE    Comment: (NOTE) SARS-CoV-2 target nucleic acids are NOT DETECTED.  The SARS-CoV-2 RNA is generally detectable in upper and lower respiratory specimens during the acute phase of infection. The lowest concentration of SARS-CoV-2 viral copies this assay can detect is 250 copies / mL. A negative result does not preclude SARS-CoV-2 infection and should not be used as the sole basis for treatment or other patient management decisions.  A negative result may occur with improper specimen collection / handling, submission of specimen other than nasopharyngeal swab, presence of viral mutation(s) within the areas targeted by this assay, and inadequate number of viral copies (<250 copies / mL). A negative result must be combined with clinical observations, patient history, and epidemiological information.  Fact Sheet for Patients:   RoadLapTop.co.za  Fact Sheet for Healthcare Providers: http://kim-miller.com/  This test is not yet approved or  cleared by the [place] FDA and has been authorized for detection and/or diagnosis of SARS-CoV-2 by FDA under an Emergency Use Authorization (EUA).  This EUA will remain in effect (meaning this test can be used) for the duration of the COVID-19 declaration under Section 564(Howe)(1) of the Act, 21 U.S.C. section 360bbb-3(Howe)(1), unless the  authorization is terminated or revoked sooner.  Performed at [facility], [address] [place], [place] [address]   TSH     Status: None   Collection Time: 05/15/22  6:48 AM  Result Value Ref Range   TSH 1.377 0.350 - 4.500 uIU/mL    Comment: Performed by a 3rd Generation assay with a functional sensitivity of <=0.01 uIU/mL. Performed at [facility], [address], [place], [place] [address]   Lipid panel     Status: Abnormal   Collection Time: 05/15/22  6:48 AM  Result Value Ref Range   Cholesterol 209 (H) 0 - 200 mg/dL   Triglycerides 628 <315 mg/dL   HDL 51 >17 mg/dL   Total CHOL/HDL Ratio 4.1 RATIO   VLDL 27 0 - 40 mg/dL   LDL Cholesterol 616 (H) 0 - 99 mg/dL    Comment:        Total Cholesterol/HDL:CHD Risk Coronary Heart Disease Risk Table                     Men   Women  1/2 Average Risk   3.4   3.3  Average Risk       5.0  4.4  2 X Average Risk   9.6   7.1  3 X Average Risk  23.4   11.0        Use the calculated Patient Ratio above and the CHD Risk Table to determine the patient's CHD Risk.        ATP III CLASSIFICATION (LDL):  <100     mg/dL   Optimal  675-916  mg/dL   Near or Above                    Optimal  130-159  mg/dL   Borderline  384-665  mg/dL   High  >993     mg/dL   Very High Performed at [facility], [address], [place], [place] [address]     Blood Alcohol level:  Lab Results  Component Value Date   [facility] <10 05/12/2022   ETH <10 12/19/2019    Metabolic Disorder Labs: Lab Results  Component Value Date   HGBA1C 4.7 (L) 12/19/2019   MPG 88.19 12/19/2019   MPG 93.93 05/27/2019   No results found for: "PROLACTIN" Lab Results  Component Value Date   CHOL 209 (H) 05/15/2022   TRIG 137 05/15/2022   HDL 51 05/15/2022   CHOLHDL 4.1 05/15/2022   VLDL 27 05/15/2022   LDLCALC 131 (H) 05/15/2022   LDLCALC 146 (H) 12/19/2019    Physical Findings: AIMS: Facial and Oral  Movements Muscles of Facial Expression: None, normal Lips and Perioral Area: None, normal Jaw: None, normal Tongue: None, normal,Extremity Movements Upper (arms, wrists, hands, fingers): None, normal Lower (legs, knees, ankles, toes): None, normal, Trunk Movements Neck, shoulders, hips: None, normal, Overall Severity Severity of abnormal movements (highest score from questions above): None, normal Incapacitation due to abnormal movements: None, normal Patient's awareness of abnormal movements (rate only patient's report): No Awareness, Dental Status Current problems with teeth and/or dentures?: No Does patient usually wear dentures?: No  CIWA:    COWS:     Musculoskeletal: Strength & Muscle Tone: within normal limits Gait & Station: normal Patient leans: N/A  Psychiatric Specialty Exam:  General Appearance: appears at stated age, in hospital scrubs  Behavior: pleasant and cooperative   Psychomotor Activity: no psychomotor agitation or retardation noted   Eye Contact: fair Speech: normal amount, tone, volume and fluency    Mood: anxious and euthymic Affect: congruent, pleasant and interactive   Thought Process: linear, goal directed, no circumstantial or tangential thought process noted, no racing thoughts or flight of ideas  Descriptions of Associations: intact  Thought Content: no bizarre content, logical and future-oriented  Hallucinations: denies AH, VH , does not appear responding to stimuli  Delusions: no paranoia, delusions of control, grandeur, ideas of reference, thought broadcasting, and magical thinking  Suicidal Thoughts: denies SI, intention, plan  Homicidal Thoughts: denies HI, intention, plan   Alertness/Orientation: alert and fully oriented   Insight: fair Judgment: fair  Memory: intact   Executive Functions  Concentration: intact  Attention Span: fair  Recall: intact  Fund of Knowledge: fair      Assets  Housing; Social  Support   Physical Exam  Constitutional:      Appearance: Normal appearance.  Cardiovascular:     Rate and Rhythm: Normal rate.  Pulmonary:     Effort: Pulmonary effort is normal.  Neurological:     General: No focal deficit present.     Mental Status: Alert and oriented to person, place, and time.    Review  of Systems  Constitutional: Negative.  Negative for chills, fever and weight loss.  HENT: Negative.    Eyes: Negative.   Respiratory: Negative.    Cardiovascular: Negative.   Gastrointestinal:  Negative for constipation, diarrhea, nausea and vomiting.  Genitourinary: Negative.   Musculoskeletal: Negative.   Skin: Negative.   Neurological: Negative.  Negative for tingling.      ASSESSMENT:  Diagnoses / Active Problems: Principal Problem: Schizoaffective disorder, bipolar type (HCC) Diagnosis: Principal Problem:   Schizoaffective disorder, bipolar type (HCC) Active Problems:   Benzodiazepine dependence (HCC)   PLAN: Safety and Monitoring:  -- Involuntary admission to inpatient psychiatric unit for safety, stabilization and treatment  -- Daily contact with patient to assess and evaluate symptoms and progress in treatment  -- Patient's case to be discussed in multi-disciplinary team meeting  -- Observation Level : q15 minute checks  -- Vital signs:  q12 hours  -- Precautions: suicide, elopement, and assault  2. Medications:    Psychiatric Diagnosis and Treatment Schizoaffective disorder, bipolar type, multiple episodes, currently in acute episode Benzo dependence - Continue Depakote 500 mg twice daily for mood stabilization             - Depakote level and LFT check on 12/26 - Continue Risperdal 1 mg BID for psychotic features  - Klonopin 0.5 mg daily for 2 days due to benzo use, last day 12/24 - Discontinue home Seroquel due to restless leg adverse effect   As needed medications  Tylenol 650 mg every 6 hours as needed for pain Mylanta 30 mL every TID as  needed for indigestion Atarax 25 mg every 6 hours as needed for anxiety Milk of magnesia 30 mL daily as needed for constipation Trazodone 50 mg QHS PRN for insomnia   The risks/benefits/side-effects/alternatives to the above medication were discussed in detail with the patient and time was given for questions. The patient consents to medication trial. FDA black box warnings, if present, were discussed.   The patient is agreeable with the medication plan, as above. We will monitor the patient's response to pharmacologic treatment, and adjust medications as necessary.    3. Pertinent labs:  EKG monitoring: QTc: 458 CMP WNL CBC WNL Salicylate and acetaminophen low Alcohol <10 TSH WNL Lipid panel: chol 209, LDL 131  Ordered: A1c    4. Group and Therapy: -- Encouraged patient to participate in unit milieu and in scheduled group therapies   -- Short Term Goals: Ability to identify changes in lifestyle to reduce recurrence of condition will improve, Ability to verbalize feelings will improve, Ability to disclose and discuss suicidal ideas, Ability to demonstrate self-control will improve, Ability to identify and develop effective coping behaviors will improve, Ability to maintain clinical measurements within normal limits will improve, Compliance with prescribed medications will improve, and Ability to identify triggers associated with substance abuse/mental health issues will improve  -- Long Term Goals: Improvement in symptoms so as ready for discharge  5. Discharge Planning:   -- Social work and case management to assist with discharge planning and identification of hospital follow-up needs prior to discharge  -- Estimated LOS: 5-7 days  -- Discharge Concerns: Need to establish a safety plan; Medication compliance and effectiveness  -- Discharge Goals: Return home with outpatient referrals for mental health follow-up including medication management/psychotherapy      Total Time Spent  in Direct Patient Care:  I personally spent 30 minutes on the unit in direct patient care. The direct patient care time included face-to-face  time with the patient, reviewing the patient's chart, communicating with other professionals, and coordinating care. Greater than 50% of this time was spent in counseling or coordinating care with the patient regarding goals of hospitalization, psycho-education, and discharge planning needs.   Walter Muss, Howe PGY-1 Psychiatry 05/16/2022, 7:00 AM

## 2022-05-17 DIAGNOSIS — F250 Schizoaffective disorder, bipolar type: Secondary | ICD-10-CM

## 2022-05-17 LAB — HEPATIC FUNCTION PANEL
ALT: 24 U/L (ref 0–44)
AST: 44 U/L — ABNORMAL HIGH (ref 15–41)
Albumin: 3.7 g/dL (ref 3.5–5.0)
Alkaline Phosphatase: 53 U/L (ref 38–126)
Bilirubin, Direct: <0.1 mg/dL (ref 0.0–0.2)
Total Bilirubin: 0.3 mg/dL (ref 0.3–1.2)
Total Protein: 7 g/dL (ref 6.5–8.1)

## 2022-05-17 LAB — HEMOGLOBIN A1C
Hgb A1c MFr Bld: 5.3 % (ref 4.8–5.6)
Mean Plasma Glucose: 105 mg/dL

## 2022-05-17 LAB — VALPROIC ACID LEVEL: Valproic Acid Lvl: 53 ug/mL (ref 50.0–100.0)

## 2022-05-17 MED ORDER — TRAZODONE HCL 50 MG PO TABS: 50.0000 mg | ORAL_TABLET | Freq: Every evening | ORAL | 0 refills | Status: AC | PRN

## 2022-05-17 MED ORDER — NICOTINE POLACRILEX 2 MG MT GUM: 2.0000 mg | CHEWING_GUM | OROMUCOSAL | 0 refills | Status: AC | PRN

## 2022-05-17 MED ORDER — DIVALPROEX SODIUM 500 MG PO DR TAB: 500.0000 mg | DELAYED_RELEASE_TABLET | Freq: Two times a day (BID) | ORAL | 0 refills | Status: AC

## 2022-05-17 MED ORDER — RISPERIDONE 1 MG PO TBDP: 1.0000 mg | ORAL_TABLET | Freq: Two times a day (BID) | ORAL | 0 refills | Status: AC

## 2022-05-17 NOTE — Progress Notes (Addendum)
Patient is discharging at this time. Patient is A&Ox4. Stable condition. Patient denies SI,HI, and A/V/H with no plan/intent. Printed AVS reviewed with and given to patient along with medications and follow up appointments. Patient verbalized all understanding. All valuables/belongings returned to patient. Patient is being transported by his father. Patient denies any pain/discomfort. No s/s of current distress.

## 2022-05-17 NOTE — Progress Notes (Signed)
  [facility] Adult Case Management Discharge Plan :  Will you be returning to the same living situation after discharge:  Yes,  home At discharge, do you have transportation home?: Yes,  father will be picking patient up Do you have the ability to pay for your medications: Yes, patient has family support and provided resources for discounted services.   Release of information consent forms completed and in the chart;  Patient's signature needed at discharge.  Patient to Follow up at:  Follow-up Information     [facility]. Go to.   Specialty: Urgent Care Why: Please go to this provider Monday through Friday during walk in hours to get connected to therapy and med management.  Please go at 7:15am as it is a first come first serve basis. Contact information: [address] [place] [address] [phone]                Next level of care provider has access to [facility] Link:yes  Safety Planning and Suicide Prevention discussed: Yes,  father, Walter Howe     Has patient been referred to the Quitline?: Patient refused referral  Patient has been referred for addiction treatment: N/A  Walter Shell E Virgil Lightner, LCSW 05/17/2022, 10:23 AM

## 2022-05-17 NOTE — Discharge Summary (Signed)
Physician Discharge Summary Note Patient:  Walter Howe is an 36 y.o., adult MRN:  [id number] DOB:  [birth year] Patient phone:  [phone] (home)  Patient address:   [address] [place] [address],  Total Time spent with patient: 45 minutes  Date of Admission:  05/13/2022 Date of Discharge: 05/17/2022  Reason for Admission:  psychosis  Principal Problem: Schizoaffective disorder, bipolar type [facility]) Discharge Diagnoses: Principal Problem:   Schizoaffective disorder, bipolar type (HCC) Active Problems:   Benzodiazepine dependence (HCC)   Past Psychiatric History: Previous Psych Diagnoses: Schizoaffective d/o bipolar type, anxiety and ADHD  Current outpatient psychiatric treatment:  Buspar 15 mg TID Klonopin 0.5 mg BID PRN Depakote 250 mg TID Prozac 80 mg Atarax 25 mg TID PRN Seroquel 50 mg QHS   Current psychiatrist: [facility]   Previous hospitalizations: once, years ago and unsure why   Previous psychiatric treatment: In 05/2020,  Olanzapine 20 mg at bedtime Trazodone 50 mg at bedtime   Psychiatric medication compliance history: Poor Neuromodulation history: Denies Current therapist: Denies   History of suicide attempts: denies  Past Medical History:  Past Medical History:  Diagnosis Date   Anxiety    03/05/2019- in the past, not current   History of kidney stones    passed    Past Surgical History:  Procedure Laterality Date   NO PAST SURGERIES     ORIF HUMERUS FRACTURE Right 03/07/2019   Procedure: OPEN REDUCTION INTERNAL FIXATION (ORIF) RIGHT HUMERUS;  Surgeon: Tarry Kos, MD;  Location: [facility] OR;  Service: Orthopedics;  Laterality: Right;    Family History:  Medical: Pt unsure Psych: Mother has ADHD Psych Rx: Pt unsure Suicide: Denies Substance use family hx: Pt unsure  Social History:  Place of birth and grew up where: [place] Abuse: Denies Marital Status: Single Children: Denies Employment: Unemployed Education: 12th  grade Housing: lives with father, mother, and sister Legal: 1 DUI Military: Denies Weapons: Denies  Hospital Course:   During the patient's hospitalization, patient had extensive initial psychiatric evaluation, and follow-up psychiatric evaluations every day.  Psychiatric diagnoses provided upon initial assessment: Principal Problem:   Schizoaffective disorder, bipolar type (HCC) Active Problems:   Benzodiazepine dependence (HCC)   The following medications were managed:  acetaminophen, alum & mag hydroxide-simeth, hydrOXYzine, magnesium hydroxide, nicotine polacrilex, traZODone  divalproex  500 mg Oral BID   risperiDONE  1 mg Oral BID    During the hospitalization, patient had the following lab / imaging / testing abnormalities which require further evaluation / management / treatment: AST 44  Patient's care was discussed during the interdisciplinary team meeting every day during the hospitalization.  The patient denies any side effects to prescribed psychiatric medication.  TRYPP HECKMANN is a 36 y.o. adult with a history of  schizoaffective disorder, bipolar type, who was initially admitted for inpatient psychiatric hospitalization on 05/13/2022 for management of psychosis in the setting of medication noncompliance.   Gradually, patient started adjusting to milieu. The patient was evaluated each day by a clinical provider to ascertain response to treatment. Improvement was noted by the patient's report of decreasing symptoms, improved sleep and appetite, affect, medication tolerance, behavior, and participation in unit programming.  Patient was asked each day to complete a self inventory noting mood, mental status, pain, new symptoms, anxiety and concerns.    Symptoms were reported as significantly decreased or resolved completely by discharge.   On day of discharge, the patient reports that their mood is stable. The patient denied having suicidal  thoughts for more than 48 hours  prior to discharge.  Patient denies having homicidal thoughts.  Patient denies having auditory hallucinations.  Patient denies any visual hallucinations or other symptoms of psychosis. The patient was motivated to continue taking medication with a goal of continued improvement in mental health.   The patient reports their target psychiatric symptoms of psychosis responded well to the psychiatric medications, and the patient reports overall benefit other psychiatric hospitalization. Supportive psychotherapy was provided to the patient. The patient also participated in regular group therapy while hospitalized. Coping skills, problem solving as well as relaxation therapies were also part of the unit programming.  Labs were reviewed with the patient, and abnormal results were discussed with the patient.  The patient is able to verbalize their individual safety plan to this provider.  Behavioral Events: none  Restraints: none  # It is recommended to the patient to continue psychiatric medications as prescribed, after discharge from the hospital.    # It is recommended to the patient to follow up with your outpatient psychiatric provider and PCP.  # It was discussed with the patient, the impact of alcohol, drugs, tobacco have been there overall psychiatric and medical wellbeing, and total abstinence from substance use was recommended to the patient.  # Prescriptions provided or sent directly to preferred pharmacy at discharge. Patient agreeable to plan. Given opportunity to ask questions. Appears to feel comfortable with discharge.    # In the event of worsening symptoms, the patient is instructed to call the crisis hotline, 911 and or go to the nearest ED for appropriate evaluation and treatment of symptoms. To follow-up with primary care provider for other medical issues, concerns and or health care needs  # Patient was discharged home with a plan to follow up as noted below.  Physical  Findings: AIMS: Facial and Oral Movements Muscles of Facial Expression: None, normal Lips and Perioral Area: None, normal Jaw: None, normal Tongue: None, normal,Extremity Movements Upper (arms, wrists, hands, fingers): None, normal Lower (legs, knees, ankles, toes): None, normal, Trunk Movements Neck, shoulders, hips: None, normal, Overall Severity Severity of abnormal movements (highest score from questions above): None, normal Incapacitation due to abnormal movements: None, normal Patient's awareness of abnormal movements (rate only patient's report): No Awareness, Dental Status Current problems with teeth and/or dentures?: No Does patient usually wear dentures?: No  CIWA:    COWS:     Mental Status Exam: General Appearance: appears at stated age, in casual clothing and scrub pants  Behavior: pleasant and cooperative   Psychomotor Activity: no psychomotor agitation or retardation noted   Eye Contact: good  Speech: normal amount, tone, volume and fluency    Mood: euthymic  Affect: congruent, pleasant and interactive   Thought Process: linear, goal directed, no circumstantial or tangential thought process noted, no racing thoughts or flight of ideas  Descriptions of Associations: intact  Thought Content: no bizarre content, logical and future-oriented  Hallucinations: denies AH, VH , does not appear responding to stimuli  Delusions: no paranoia, delusions of control, grandeur, ideas of reference, thought broadcasting, and magical thinking  Suicidal Thoughts: denies SI, intention, plan  Homicidal Thoughts: denies HI, intention, plan   Alertness/Orientation: alert and fully oriented   Insight: fair Judgment: fair  Memory: intact   Executive Functions  Concentration: intact  Attention Span: fair  Recall: intact  Fund of Knowledge: fair    Physical Exam  Constitutional:      Appearance: Normal appearance.  Cardiovascular:  Rate and Rhythm: Normal rate.   Pulmonary:     Effort: Pulmonary effort is normal.  Neurological:     General: No focal deficit present.     Mental Status: Alert and oriented to person, place, and time.    Review of Systems  Constitutional: Negative.  Negative for chills, fever and weight loss.  HENT: Negative.    Eyes: Negative.   Respiratory: Negative.    Cardiovascular: Negative.   Gastrointestinal:  Negative for constipation, diarrhea, nausea and vomiting.  Genitourinary: Negative.   Musculoskeletal: Negative.   Skin: Negative.   Neurological: Negative.  Negative for tingling.     Blood pressure 107/68, pulse (!) 143, temperature (!) 97.2 F (36.2 C), temperature source Oral, resp. rate 18, height 5\' 7"  (1.702 m), weight 45.8 kg, SpO2 98 %. Body mass index is 15.82 kg/m.  Assets  Assets:Housing; Social Support   Social History   Tobacco Use  Smoking Status Every Day   Types: Cigarettes, E-cigarettes   Last attempt to quit: 02/20/2019   Years since quitting: 3.2  Smokeless Tobacco Never   Tobacco Cessation:  A prescription for an FDA-approved tobacco cessation medication provided at discharge  Blood Alcohol level:  Lab Results  Component Value Date   [facility] <10 05/12/2022   ETH <10 [id number]    Metabolic Disorder Labs:  Lab Results  Component Value Date   HGBA1C 5.3 05/15/2022   MPG 105 05/15/2022   MPG 88.19 12/19/2019   No results found for: "PROLACTIN" Lab Results  Component Value Date   CHOL 209 (H) 05/15/2022   TRIG 137 05/15/2022   HDL 51 05/15/2022   CHOLHDL 4.1 05/15/2022   VLDL 27 05/15/2022   LDLCALC 131 (H) 05/15/2022   LDLCALC 146 (H) 12/19/2019    Discharge destination: home  Is patient on multiple antipsychotic therapies at discharge:  No   Has Patient had three or more failed trials of antipsychotic monotherapy by history:  No  Recommended Plan for Multiple Antipsychotic Therapies: NA  Discharge Instructions     Diet - low sodium heart healthy   Complete  by: As directed    Increase activity slowly   Complete by: As directed       Allergies as of 05/17/2022   No Known Allergies      Medication List     STOP taking these medications    busPIRone 15 MG tablet Commonly known as: BUSPAR   clonazePAM 0.5 MG tablet Commonly known as: KlonoPIN   QUEtiapine 50 MG tablet Commonly known as: SEROQUEL       TAKE these medications      Indication  divalproex 500 MG DR tablet Commonly known as: DEPAKOTE Take 1 tablet (500 mg total) by mouth 2 (two) times daily. What changed:  medication strength how much to take when to take this  Indication: Schizophrenia   nicotine polacrilex 2 MG gum Commonly known as: NICORETTE Take 1 each (2 mg total) by mouth as needed for smoking cessation.  Indication: Nicotine Addiction   risperiDONE 1 MG disintegrating tablet Commonly known as: RISPERDAL M-TABS Take 1 tablet (1 mg total) by mouth 2 (two) times daily.  Indication: Schizophrenia   traZODone 50 MG tablet Commonly known as: DESYREL Take 1 tablet (50 mg total) by mouth at bedtime as needed for sleep.  Indication: [place]. Go to.   Specialty: Urgent Care Why: Please  go to this provider Monday through Friday during walk in hours to get connected to therapy and med management.  Please go at 7:15am as it is a first come first serve basis. Contact information: [place] [phone]                Discharge recommendations:  Please see PCP for slightly elevated AST.  Activity: as tolerated  Diet: heart healthy  # It is recommended to the patient to continue psychiatric medications as prescribed, after discharge from the hospital.     # It is recommended to the patient to follow up with your outpatient psychiatric provider -instructions on appointment date, time, and address (location) are provided  to you in discharge paperwork  # Follow-up with outpatient primary care doctor and other specialists -for management of chronic medical disease, including: none  # Testing: Follow-up with outpatient provider for abnormal lab results: AST 44   # It was discussed with the patient, the impact of alcohol, drugs, tobacco have been there overall psychiatric and medical wellbeing, and total abstinence from substance use was recommended to the patient.   # Prescriptions provided or sent directly to preferred pharmacy at discharge. Patient agreeable to plan. Given opportunity to ask questions. Appears to feel comfortable with discharge.    # In the event of worsening symptoms, the patient is instructed to call the crisis hotline, 911, and or go to the nearest ED for appropriate evaluation and treatment of symptoms. To follow-up with primary care provider for other medical issues, concerns and or health care needs  Patient agrees with D/C instructions and plan.   I discussed my assessment, planned testing and intervention for the patient with Dr. Winfred Leeds who agrees with my formulated course of action.   Signed: Alesia Morin, MD, PGY-1 05/17/2022, 10:13 AM

## 2022-05-17 NOTE — [facility] Suicide Risk Assessment (Signed)
Suicide Risk Assessment  Discharge Assessment    [facility] Discharge Suicide Risk Assessment   Principal Problem: Schizoaffective disorder, bipolar type [facility]) Discharge Diagnoses: Principal Problem:   Schizoaffective disorder, bipolar type ([place]) Active Problems:   Benzodiazepine dependence ([place])   Total Time spent with patient: 30 minutes   Walter Howe is a 36 y.o. adult with a history of  schizoaffective disorder, bipolar type, who was initially admitted for inpatient psychiatric hospitalization on 05/13/2022 for management of psychosis in the setting of medication noncompliance.     During the patient's hospitalization, patient had extensive initial psychiatric evaluation, and follow-up psychiatric evaluations every day.  Psychiatric diagnoses provided upon initial assessment: schizoaffective bipolar disorder  Patient's psychiatric medications were adjusted on admission: started depakote 500 mg BID and risperdal 1 mg BID  During the hospitalization, other adjustments were made to the patient's psychiatric medication regimen: none  Patient's care was discussed during the interdisciplinary team meeting every day during the hospitalization.  The patient denied having side effects to prescribed psychiatric medication.  Gradually, patient started adjusting to milieu. The patient was evaluated each day by a clinical provider to ascertain response to treatment. Improvement was noted by the patient's report of decreasing symptoms, improved sleep and appetite, affect, medication tolerance, behavior, and participation in unit programming.  Patient was asked each day to complete a self inventory noting mood, mental status, pain, new symptoms, anxiety and concerns.    Symptoms were reported as significantly decreased or resolved completely by discharge.   On day of discharge, the patient reports that their mood is stable. The patient denied having suicidal thoughts for more than 48 hours prior  to discharge.  Patient denies having homicidal thoughts.  Patient denies having auditory hallucinations.  Patient denies any visual hallucinations or other symptoms of psychosis. The patient was motivated to continue taking medication with a goal of continued improvement in mental health.   The patient reports their target psychiatric symptoms of psychosis responded well to the psychiatric medications, and the patient reports overall benefit other psychiatric hospitalization. Supportive psychotherapy was provided to the patient. The patient also participated in regular group therapy while hospitalized. Coping skills, problem solving as well as relaxation therapies were also part of the unit programming.  Labs were reviewed with the patient, and abnormal results were discussed with the patient.  The patient is able to verbalize their individual safety plan to this provider.  # It is recommended to the patient to continue psychiatric medications as prescribed, after discharge from the hospital.    # It is recommended to the patient to follow up with your outpatient psychiatric provider and PCP.  # It was discussed with the patient, the impact of alcohol, drugs, tobacco have been there overall psychiatric and medical wellbeing, and total abstinence from substance use was recommended the patient.ed.  # Prescriptions provided or sent directly to preferred pharmacy at discharge. Patient agreeable to plan. Given opportunity to ask questions. Appears to feel comfortable with discharge.    # In the event of worsening symptoms, the patient is instructed to call the crisis hotline, 911 and or go to the nearest ED for appropriate evaluation and treatment of symptoms. To follow-up with primary care provider for other medical issues, concerns and or health care needs  # Patient was discharged home with a plan to follow up as noted below.    Musculoskeletal: Strength & Muscle Tone: within normal limits Gait  & Station: normal Patient leans: N/A  Psychiatric Specialty  Exam  Mental Status Exam: General Appearance: appears at stated age, in casual clothing and scrub pants   Behavior: pleasant and cooperative    Psychomotor Activity: no psychomotor agitation or retardation noted    Eye Contact: good  Speech: normal amount, tone, volume and fluency      Mood: euthymic  Affect: congruent, pleasant and interactive    Thought Process: linear, goal directed, no circumstantial or tangential thought process noted, no racing thoughts or flight of ideas  Descriptions of Associations: intact  Thought Content: no bizarre content, logical and future-oriented  Hallucinations: denies AH, VH , does not appear responding to stimuli  Delusions: no paranoia, delusions of control, grandeur, ideas of reference, thought broadcasting, and magical thinking  Suicidal Thoughts: denies SI, intention, plan  Homicidal Thoughts: denies HI, intention, plan    Alertness/Orientation: alert and fully oriented    Insight: fair Judgment: fair   Memory: intact    Executive Functions  Concentration: intact  Attention Span: fair  Recall: intact  Fund of Knowledge: fair      Physical Exam  Constitutional:      Appearance: Normal appearance.  Cardiovascular:     Rate and Rhythm: Normal rate.  Pulmonary:     Effort: Pulmonary effort is normal.  Neurological:     General: No focal deficit present.     Mental Status: Alert and oriented to person, place, and time.      Review of Systems  Constitutional: Negative.  Negative for chills, fever and weight loss.  HENT: Negative.    Eyes: Negative.   Respiratory: Negative.    Cardiovascular: Negative.   Gastrointestinal:  Negative for constipation, diarrhea, nausea and vomiting.  Genitourinary: Negative.   Musculoskeletal: Negative.   Skin: Negative.   Neurological: Negative.  Negative for tingling.   Blood pressure 107/68, pulse (!) 143, temperature (!) 97.2  F (36.2 C), temperature source Oral, resp. rate 18, height 5\' 7"  (1.702 m), weight 45.8 kg, SpO2 98 %. Body mass index is 15.82 kg/m.  Mental Status Per Nursing Assessment::   On Admission:  NA  Demographic Factors:  Male, Caucasian, and Gay, lesbian, or bisexual orientation Loss Factors: NA Historical Factors: NA Risk Reduction Factors:   Living with another person, especially a relative and Positive social support   Continued Clinical Symptoms:  Schizophrenia:   Less than 26 years old  Cognitive Features That Contribute To Risk:  None    Suicide Risk:   Minimal: No identifiable suicidal ideation.  Patients presenting with no risk factors but with morbid ruminations; may be classified as minimal risk based on the severity of the depressive symptoms    Plan Of Care/Follow-up recommendations:  Please see PCP for slightly elevated AST.   Activity: as tolerated  Diet: heart healthy  Other: -Follow-up with your outpatient psychiatric provider -instructions on appointment date, time, and address (location) are provided to you in discharge paperwork.  -Take your psychiatric medications as prescribed at discharge - instructions are provided to you in the discharge paperwork  -Follow-up with outpatient primary care doctor and other specialists -for management of preventative medicine and chronic medical disease, including: none  -Testing: Follow-up with outpatient provider for abnormal lab results:  AST 44   -Recommend abstinence from alcohol, tobacco, and other illicit drug use at discharge.   -If your psychiatric symptoms recur, worsen, or if you have side effects to your psychiatric medications, call your outpatient psychiatric provider, 911, [phone] or go to the nearest emergency department.  -If  suicidal thoughts recur, call your outpatient psychiatric provider, 911, 988 or go to the nearest emergency department.     Alesia Morin, MD 05/17/2022, 8:53 AM

## 2022-05-17 NOTE — Progress Notes (Signed)
   05/17/22 0600  15 Minute Checks  Location Bedroom  Visual Appearance Calm  Behavior Composed  Sleep (Behavioral Health Patients Only)  Calculate sleep? (Click Yes once per 24 hr at 0600 safety check) Yes  Documented sleep last 24 hours 7.25

## 2022-05-17 NOTE — Plan of Care (Signed)
Patient did not meet goal due to only attending one recreation therapy group session before being discharged.  Walter Howe, LRT,CTRS

## 2022-05-17 NOTE — Progress Notes (Signed)
Recreation Therapy Notes  INPATIENT RECREATION TR PLAN  Patient Details Name: Walter Howe MRN: [id number] DOB: [birth year] Today's Date: 05/17/2022  Rec Therapy Plan Is patient appropriate for Therapeutic Recreation?: Yes Treatment times per week: about 3 days Estimated Length of Stay: 5-7 days TR Treatment/Interventions: Group participation (Comment)  Discharge Criteria Pt will be discharged from therapy if:: Discharged Treatment plan/goals/alternatives discussed and agreed upon by:: Patient/family  Discharge Summary Short term goals set: See patient care plan Short term goals met: Not met Progress toward goals comments: Groups attended Which groups?: Coping skills Reason goals not met: Patient attended one group session. Therapeutic equipment acquired: N/A Reason patient discharged from therapy: Discharge from hospital Pt/family agrees with progress & goals achieved: Yes Date patient discharged from therapy: 05/17/22   Millie Forde-McCall, LRT,CTRS Anastasiya Gowin A Numa Heatwole-McCall 05/17/2022, 12:17 PM

## 2022-05-17 NOTE — Group Note (Signed)
Recreation Therapy Group Note   Group Topic:Coping Skills  Group Date: 05/17/2022 Start Time: 1000 End Time: 1035 Facilitators: Paul Torpey-McCall, LRT,CTRS Location: 500 [address] Dayroom   Goal Area(s) Addresses:  Patient will identify positive coping techniques. Patient will identify benefits of using coping skills post d/c.  Group Description:  Mind Map.  Patient was provided a blank template of a diagram with 32 blank boxes in a tiered system, branching from the center (similar to a bubble chart). LRT directed patients to label the middle of the diagram "Coping Skills" and consider 8 different instances in which coping skills can be used (anxiety, depression, family, children, job, negativity, money and temper).  Patients were to then come up with 3 effective coping techniques to address each identified stressor in the three boxes extending from the second row of boxes.     Affect/Mood: Appropriate   Participation Level: Minimal   Participation Quality: Independent   Behavior: Appropriate   Speech/Thought Process: Quiet   Insight: None   Judgement: None   Modes of Intervention: Worksheet   Patient Response to Interventions:  Attentive   Education Outcome:  Acknowledges education and In group clarification offered    Clinical Observations/Individualized Feedback: Pt attended group session.  Pt was seen filling out mind map.  Pt didn't share any coping skills he came up with with the group.  Pt was quiet but appeared to be attentive.    Plan: Continue to engage patient in RT group sessions 2-3x/week.   Annaliza Zia-McCall, LRT,CTRS 05/17/2022 11:18 AM

## 2023-07-12 ENCOUNTER — Ambulatory Visit: Payer: Self-pay | Admitting: Family Medicine

## 2023-08-28 ENCOUNTER — Encounter (HOSPITAL_COMMUNITY): Payer: Self-pay

## 2023-08-28 ENCOUNTER — Emergency Department (HOSPITAL_COMMUNITY)
Admission: EM | Admit: 2023-08-28 | Discharge: 2023-08-28 | Disposition: A | Discharge status: Home / Self Care | Payer: Self-pay | Attending: Emergency Medicine | Admitting: Emergency Medicine

## 2023-08-28 ENCOUNTER — Emergency Department (HOSPITAL_COMMUNITY): Payer: Self-pay

## 2023-08-28 ENCOUNTER — Other Ambulatory Visit: Payer: Self-pay

## 2023-08-28 DIAGNOSIS — L03032 Cellulitis of left toe: Secondary | ICD-10-CM | POA: Insufficient documentation

## 2023-08-28 DIAGNOSIS — L089 Local infection of the skin and subcutaneous tissue, unspecified: Secondary | ICD-10-CM

## 2023-08-28 HISTORY — DX: Major depressive disorder, single episode, unspecified: F32.9

## 2023-08-28 HISTORY — DX: Schizoaffective disorder, bipolar type: F25.0

## 2023-08-28 LAB — CBC WITH DIFFERENTIAL/PLATELET
Abs Immature Granulocytes: 0.04 10*3/uL (ref 0.00–0.07)
Basophils Absolute: 0 10*3/uL (ref 0.0–0.1)
Basophils Relative: 0 %
Eosinophils Absolute: 0 10*3/uL (ref 0.0–0.5)
Eosinophils Relative: 0 %
HCT: 43.4 % (ref 39.0–52.0)
Hemoglobin: 14.5 g/dL (ref 13.0–17.0)
Immature Granulocytes: 1 %
Lymphocytes Relative: 14 %
Lymphs Abs: 1.1 10*3/uL (ref 0.7–4.0)
MCH: 30 pg (ref 26.0–34.0)
MCHC: 33.4 g/dL (ref 30.0–36.0)
MCV: 89.7 fL (ref 80.0–100.0)
Monocytes Absolute: 0.3 10*3/uL (ref 0.1–1.0)
Monocytes Relative: 4 %
Neutro Abs: 6.6 10*3/uL (ref 1.7–7.7)
Neutrophils Relative %: 81 %
Platelets: 168 10*3/uL (ref 150–400)
RBC: 4.84 MIL/uL (ref 4.22–5.81)
RDW: 11.6 % (ref 11.5–15.5)
WBC: 8.1 10*3/uL (ref 4.0–10.5)
nRBC: 0 % (ref 0.0–0.2)

## 2023-08-28 LAB — BASIC METABOLIC PANEL WITH GFR
Anion gap: 14 (ref 5–15)
BUN: 16 mg/dL (ref 6–20)
CO2: 24 mmol/L (ref 22–32)
Calcium: 9 mg/dL (ref 8.9–10.3)
Chloride: 97 mmol/L — ABNORMAL LOW (ref 98–111)
Creatinine, Ser: 1.04 mg/dL (ref 0.61–1.24)
GFR, Estimated: >60 mL/min (ref >60)
Glucose, Bld: 88 mg/dL (ref 70–99)
Potassium: 3.7 mmol/L (ref 3.5–5.1)
Sodium: 135 mmol/L (ref 135–145)

## 2023-08-28 MED ORDER — AMOXICILLIN-POT CLAVULANATE 875-125 MG PO TABS: 1.0000 | ORAL_TABLET | Freq: Two times a day (BID) | ORAL | 0 refills | Status: AC

## 2023-08-28 MED ORDER — BACITRACIN ZINC 500 UNIT/GM EX OINT
TOPICAL_OINTMENT | CUTANEOUS | Status: AC
  Filled 2023-08-28: qty 0.9

## 2023-08-28 NOTE — ED Notes (Addendum)
 Pt coming from PCP office with worsening toe infection, concern for Osteomyelitis, pt has Schizo-affective disorder. Pt will have notes from PCP with him upon arrival.

## 2023-08-28 NOTE — ED Triage Notes (Signed)
 Pt has a paronychia to the L great toe that has been managed by his PCP. He was referred to the ED today due to it not improving with concerns of possible osteomyelitis. Denies fevers, chills. Pt is being treated with doxycycline x 2 weeks and he has 3 doses left. Pt is also taking Bactrim for the last 2 months for ongoing kidney infection.

## 2023-08-28 NOTE — Discharge Instructions (Addendum)
 Discontinue your doxycycline and start Augmentin.  Follow-up with Dr. Susa Simmonds with orthopedics/foot doctor.  Please keep this area clean and dry.  Recommend bacitracin Neosporin ointment on this wound twice daily for the next 7 days as well.  Return if symptoms worsen.

## 2023-08-28 NOTE — ED Provider Notes (Signed)
 [place] EMERGENCY DEPARTMENT AT [facility] Provider Note   CSN: [id number] Arrival date & time: 08/28/23  1241     History  Chief Complaint  Patient presents with   Wound Infection    Walter Howe is a 38 y.o. male.  Patient here for evaluation of his left great toe.  Is been on doxycycline for 10 to 14 days for left toe infection.  Primary doctor concerned that is not healing well.  Sent to rule out osteomyelitis.  Patient denies any fevers or chills.  He is also on some Bactrim for maybe a kidney infection but is not have any urinary symptoms.  Denies any weakness numbness tingling.  Denies any fevers or chills.  Denies any trauma.  The history is provided by the patient.       Home Medications Prior to Admission medications   Medication Sig Start Date End Date Taking? Authorizing Provider  amoxicillin-clavulanate (AUGMENTIN) 875-125 MG tablet Take 1 tablet by mouth every 12 (twelve) hours. 08/28/23  Yes Edgerrin Correia, DO  divalproex (DEPAKOTE) 500 MG DR tablet Take 1 tablet (500 mg total) by mouth 2 (two) times daily. 05/17/22 06/16/22  Sarita Bottom, MD  nicotine polacrilex (NICORETTE) 2 MG gum Take 1 each (2 mg total) by mouth as needed for smoking cessation. 05/17/22   Sarita Bottom, MD  risperiDONE (RISPERDAL M-TABS) 1 MG disintegrating tablet Take 1 tablet (1 mg total) by mouth 2 (two) times daily. 05/17/22 06/16/22  Sarita Bottom, MD  traZODone (DESYREL) 50 MG tablet Take 1 tablet (50 mg total) by mouth at bedtime as needed for sleep. 05/17/22   Sarita Bottom, MD      Allergies    Patient has no known allergies.    Review of Systems   Review of Systems  Physical Exam Updated Vital Signs BP 110/80   Pulse 95   Temp 98.3 F (36.8 C)   Resp 19   Ht 5\' 6"  (1.676 m)   Wt 52.6 kg   SpO2 98%   BMI 18.72 kg/m  Physical Exam Vitals and nursing note reviewed.  Constitutional:      General: He is not in acute distress.    Appearance: He is  well-developed. He is not ill-appearing.  HENT:     Head: Normocephalic and atraumatic.     Nose: Nose normal.     Mouth/Throat:     Mouth: Mucous membranes are moist.  Eyes:     Extraocular Movements: Extraocular movements intact.     Conjunctiva/sclera: Conjunctivae normal.     Pupils: Pupils are equal, round, and reactive to light.  Cardiovascular:     Rate and Rhythm: Normal rate and regular rhythm.     Pulses: Normal pulses.     Heart sounds: Normal heart sounds. No murmur heard. Pulmonary:     Effort: Pulmonary effort is normal. No respiratory distress.     Breath sounds: Normal breath sounds.  Abdominal:     General: Abdomen is flat.     Palpations: Abdomen is soft.     Tenderness: There is no abdominal tenderness.  Musculoskeletal:        General: No swelling.     Cervical back: Normal range of motion and neck supple.  Skin:    General: Skin is warm and dry.     Capillary Refill: Capillary refill takes less than 2 seconds.     Comments: A little bit of redness to the base of the left big toe but  there is some granulation tissue in this area with little bit of surrounding redness but no major drainage purulence or swelling to the foot  Neurological:     General: No focal deficit present.     Mental Status: He is alert and oriented to person, place, and time.     Cranial Nerves: No cranial nerve deficit.     Sensory: No sensory deficit.     Motor: No weakness.     Coordination: Coordination normal.  Psychiatric:        Mood and Affect: Mood normal.     ED Results / Procedures / Treatments   Labs (all labs ordered are listed, but only abnormal results are displayed) Labs Reviewed  CBC WITH DIFFERENTIAL/PLATELET  BASIC METABOLIC PANEL WITH GFR    EKG None  Radiology DG Foot Complete Left Result Date: 08/28/2023 CLINICAL DATA:  Left great toe wound EXAM: LEFT FOOT - COMPLETE 3 VIEW COMPARISON:  None Available. FINDINGS: There is no evidence of fracture or  dislocation. There is no evidence of arthropathy or other focal bone abnormality. There is some soft tissue swelling of the great toe distally. IMPRESSION: Soft tissue swelling. No acute osseous abnormalities. Electronically Signed   By: Layla Maw M.D.   On: 08/28/2023 14:00    Procedures Procedures    Medications Ordered in ED Medications - No data to display  ED Course/ Medical Decision Making/ A&P                                 Medical Decision Making Amount and/or Complexity of Data Reviewed Labs: ordered. Radiology: ordered.   Walter Howe is here for evaluation of left foot/big toe infection.  History of schizophrenia.  Currently he has been on doxycycline for about 10 days or so for this infection of his foot/toenail.  On exam to me there appears to be granulation tissue just below the fingernail of the left big toe.  There is some surrounding redness but there is no purulence or major swelling or erythema.  Seems like there is some healing tissue there.  Ultimately x-ray was done that showed no evidence of osteomyelitis or other major process.  Lab work was unremarkable.  No significant leukocytosis.  No no fever.  Seems to still be a fairly localized process.  I talked with Earney Hamburg with orthopedics and ultimately we will switch the patient over the Augmentin and have him follow-up with Dr. Susa Simmonds with orthopedics.  Recommended to keep the area clean and dry.  Discharged in good condition.  Understands return precautions.  This chart was dictated using voice recognition software.  Despite best efforts to proofread,  errors can occur which can change the documentation meaning.         Final Clinical Impression(s) / ED Diagnoses Final diagnoses:  Toe infection    Rx / DC Orders ED Discharge Orders          Ordered    amoxicillin-clavulanate (AUGMENTIN) 875-125 MG tablet  Every 12 hours        08/28/23 1450              Amina Menchaca,  DO 08/28/23 1451
